# Patient Record
Sex: Male | Born: 1945 | Race: White | Hispanic: No | Marital: Single | Smoking: Never smoker
Health system: Southern US, Community
[De-identification: ages and names within clinical notes are randomized; demographics above are authoritative.]

## PROBLEM LIST (undated history)

## (undated) DIAGNOSIS — N201 Calculus of ureter: Secondary | ICD-10-CM

## (undated) DIAGNOSIS — E785 Hyperlipidemia, unspecified: Secondary | ICD-10-CM

## (undated) DIAGNOSIS — H409 Unspecified glaucoma: Secondary | ICD-10-CM

## (undated) DIAGNOSIS — I10 Essential (primary) hypertension: Secondary | ICD-10-CM

## (undated) DIAGNOSIS — Z973 Presence of spectacles and contact lenses: Secondary | ICD-10-CM

## (undated) DIAGNOSIS — Z9189 Other specified personal risk factors, not elsewhere classified: Secondary | ICD-10-CM

## (undated) DIAGNOSIS — IMO0001 Reserved for inherently not codable concepts without codable children: Secondary | ICD-10-CM

## (undated) DIAGNOSIS — I6523 Occlusion and stenosis of bilateral carotid arteries: Secondary | ICD-10-CM

## (undated) DIAGNOSIS — R350 Frequency of micturition: Secondary | ICD-10-CM

## (undated) DIAGNOSIS — I2510 Atherosclerotic heart disease of native coronary artery without angina pectoris: Secondary | ICD-10-CM

## (undated) DIAGNOSIS — R3915 Urgency of urination: Secondary | ICD-10-CM

## (undated) DIAGNOSIS — N400 Enlarged prostate without lower urinary tract symptoms: Secondary | ICD-10-CM

## (undated) HISTORY — PX: CATARACT EXTRACTION W/ INTRAOCULAR LENS  IMPLANT, BILATERAL: SHX1307

## (undated) HISTORY — DX: Reserved for inherently not codable concepts without codable children: IMO0001

## (undated) HISTORY — PX: INGUINAL HERNIA REPAIR: SUR1180

## (undated) HISTORY — PX: CARDIOVASCULAR STRESS TEST: SHX262

## (undated) HISTORY — DX: Essential (primary) hypertension: I10

## (undated) HISTORY — DX: Hyperlipidemia, unspecified: E78.5

## (undated) HISTORY — PX: COLONOSCOPY: SHX174

---

## 2002-01-31 ENCOUNTER — Encounter: Admission: RE | Admit: 2002-01-31 | Discharge: 2002-01-31 | Payer: Self-pay | Admitting: Internal Medicine

## 2004-07-29 ENCOUNTER — Ambulatory Visit: Payer: Self-pay | Admitting: *Deleted

## 2004-07-30 ENCOUNTER — Ambulatory Visit: Payer: Self-pay | Admitting: *Deleted

## 2004-08-11 ENCOUNTER — Ambulatory Visit: Payer: Self-pay

## 2005-01-26 ENCOUNTER — Ambulatory Visit: Payer: Self-pay | Admitting: *Deleted

## 2005-01-29 ENCOUNTER — Ambulatory Visit: Payer: Self-pay | Admitting: *Deleted

## 2005-08-06 ENCOUNTER — Ambulatory Visit: Payer: Self-pay | Admitting: *Deleted

## 2005-08-12 ENCOUNTER — Ambulatory Visit: Payer: Self-pay

## 2005-08-12 ENCOUNTER — Ambulatory Visit: Payer: Self-pay | Admitting: *Deleted

## 2006-03-02 ENCOUNTER — Ambulatory Visit: Payer: Self-pay | Admitting: *Deleted

## 2006-03-10 ENCOUNTER — Ambulatory Visit: Payer: Self-pay | Admitting: *Deleted

## 2006-07-14 ENCOUNTER — Ambulatory Visit: Payer: Self-pay | Admitting: *Deleted

## 2006-07-14 LAB — CONVERTED CEMR LAB
ALT: 28 units/L (ref 0–40)
AST: 30 units/L (ref 0–37)
Albumin: 4 g/dL (ref 3.5–5.2)
Alkaline Phosphatase: 68 units/L (ref 39–117)
BUN: 20 mg/dL (ref 6–23)
Bilirubin, Direct: 0.1 mg/dL (ref 0.0–0.3)
CO2: 28 meq/L (ref 19–32)
Calcium: 9.6 mg/dL (ref 8.4–10.5)
Chloride: 106 meq/L (ref 96–112)
Chol/HDL Ratio, serum: 2.1
Cholesterol: 150 mg/dL (ref 0–200)
Creatinine, Ser: 1.4 mg/dL (ref 0.4–1.5)
GFR calc non Af Amer: 55 mL/min
Glomerular Filtration Rate, Af Am: 66 mL/min/{1.73_m2}
Glucose, Bld: 99 mg/dL (ref 70–99)
HDL: 70.3 mg/dL (ref >39.0)
LDL Cholesterol: 66 mg/dL (ref 0–99)
Potassium: 3.7 meq/L (ref 3.5–5.1)
Sodium: 141 meq/L (ref 135–145)
Total Bilirubin: 1.1 mg/dL (ref 0.3–1.2)
Total Protein: 7 g/dL (ref 6.0–8.3)
Triglyceride fasting, serum: 68 mg/dL (ref 0–149)
VLDL: 14 mg/dL (ref 0–40)

## 2006-07-27 ENCOUNTER — Ambulatory Visit: Payer: Self-pay | Admitting: *Deleted

## 2007-01-25 ENCOUNTER — Ambulatory Visit: Payer: Self-pay | Admitting: *Deleted

## 2007-01-25 LAB — CONVERTED CEMR LAB
BUN: 20 mg/dL (ref 6–23)
CO2: 29 meq/L (ref 19–32)
Calcium: 9.2 mg/dL (ref 8.4–10.5)
Chloride: 104 meq/L (ref 96–112)
Cholesterol: 175 mg/dL (ref 0–200)
Creatinine, Ser: 1.3 mg/dL (ref 0.4–1.5)
GFR calc Af Amer: 72 mL/min
GFR calc non Af Amer: 59 mL/min
Glucose, Bld: 109 mg/dL — ABNORMAL HIGH (ref 70–99)
HDL: 66.5 mg/dL (ref >39.0)
LDL Cholesterol: 90 mg/dL (ref 0–99)
Potassium: 3.9 meq/L (ref 3.5–5.1)
Sodium: 139 meq/L (ref 135–145)
Total CHOL/HDL Ratio: 2.6
Triglycerides: 91 mg/dL (ref 0–149)
VLDL: 18 mg/dL (ref 0–40)

## 2007-01-27 ENCOUNTER — Ambulatory Visit: Payer: Self-pay | Admitting: *Deleted

## 2007-07-20 LAB — CONVERTED CEMR LAB
ALT: 28 units/L (ref 0–53)
AST: 28 units/L (ref 0–37)
Albumin: 4.1 g/dL (ref 3.5–5.2)
Alkaline Phosphatase: 68 units/L (ref 39–117)
BUN: 21 mg/dL (ref 6–23)
Bilirubin, Direct: 0.2 mg/dL (ref 0.0–0.3)
CO2: 31 meq/L (ref 19–32)
Calcium: 9.5 mg/dL (ref 8.4–10.5)
Chloride: 101 meq/L (ref 96–112)
Cholesterol: 147 mg/dL (ref 0–200)
Creatinine, Ser: 1.3 mg/dL (ref 0.4–1.5)
GFR calc Af Amer: 72 mL/min
GFR calc non Af Amer: 59 mL/min
Glucose, Bld: 108 mg/dL — ABNORMAL HIGH (ref 70–99)
HDL: 64.5 mg/dL (ref >39.0)
LDL Cholesterol: 68 mg/dL (ref 0–99)
Potassium: 4 meq/L (ref 3.5–5.1)
Sodium: 140 meq/L (ref 135–145)
Total Bilirubin: 1.4 mg/dL — ABNORMAL HIGH (ref 0.3–1.2)
Total CHOL/HDL Ratio: 2.3
Total Protein: 7.2 g/dL (ref 6.0–8.3)
Triglycerides: 75 mg/dL (ref 0–149)
VLDL: 15 mg/dL (ref 0–40)

## 2007-07-22 ENCOUNTER — Ambulatory Visit: Payer: Self-pay | Admitting: Internal Medicine

## 2007-07-26 ENCOUNTER — Ambulatory Visit: Payer: Self-pay | Admitting: Internal Medicine

## 2008-07-04 ENCOUNTER — Ambulatory Visit: Payer: Self-pay

## 2008-07-04 ENCOUNTER — Ambulatory Visit: Payer: Self-pay | Admitting: Internal Medicine

## 2008-07-04 LAB — CONVERTED CEMR LAB
ALT: 33 units/L (ref 0–53)
AST: 30 units/L (ref 0–37)
Albumin: 4.1 g/dL (ref 3.5–5.2)
Alkaline Phosphatase: 63 units/L (ref 39–117)
BUN: 22 mg/dL (ref 6–23)
Bilirubin, Direct: 0.1 mg/dL (ref 0.0–0.3)
CO2: 27 meq/L (ref 19–32)
Calcium: 9.3 mg/dL (ref 8.4–10.5)
Chloride: 104 meq/L (ref 96–112)
Cholesterol: 146 mg/dL (ref 0–200)
Creatinine, Ser: 1.4 mg/dL (ref 0.4–1.5)
GFR calc Af Amer: 66 mL/min
GFR calc non Af Amer: 54 mL/min
Glucose, Bld: 118 mg/dL — ABNORMAL HIGH (ref 70–99)
HDL: 69 mg/dL (ref >39.0)
LDL Cholesterol: 63 mg/dL (ref 0–99)
Potassium: 3.5 meq/L (ref 3.5–5.1)
Sodium: 141 meq/L (ref 135–145)
Total Bilirubin: 1.7 mg/dL — ABNORMAL HIGH (ref 0.3–1.2)
Total CHOL/HDL Ratio: 2.1
Total Protein: 7.1 g/dL (ref 6.0–8.3)
Triglycerides: 70 mg/dL (ref 0–149)
VLDL: 14 mg/dL (ref 0–40)

## 2009-01-11 ENCOUNTER — Ambulatory Visit: Payer: Self-pay | Admitting: Internal Medicine

## 2009-01-11 LAB — CONVERTED CEMR LAB
ALT: 44 units/L (ref 0–53)
AST: 35 units/L (ref 0–37)
Albumin: 4.2 g/dL (ref 3.5–5.2)
Alkaline Phosphatase: 72 units/L (ref 39–117)
BUN: 15 mg/dL (ref 6–23)
Bilirubin, Direct: 0.2 mg/dL (ref 0.0–0.3)
CO2: 31 meq/L (ref 19–32)
Calcium: 9.2 mg/dL (ref 8.4–10.5)
Chloride: 107 meq/L (ref 96–112)
Cholesterol: 150 mg/dL (ref 0–200)
Creatinine, Ser: 1.3 mg/dL (ref 0.4–1.5)
GFR calc non Af Amer: 58.9 mL/min (ref >60)
Glucose, Bld: 111 mg/dL — ABNORMAL HIGH (ref 70–99)
HDL: 68.5 mg/dL (ref >39.00)
LDL Cholesterol: 65 mg/dL (ref 0–99)
Potassium: 3.6 meq/L (ref 3.5–5.1)
Sodium: 143 meq/L (ref 135–145)
Total Bilirubin: 1.4 mg/dL — ABNORMAL HIGH (ref 0.3–1.2)
Total CHOL/HDL Ratio: 2
Total Protein: 7 g/dL (ref 6.0–8.3)
Triglycerides: 83 mg/dL (ref 0.0–149.0)
VLDL: 16.6 mg/dL (ref 0.0–40.0)

## 2009-01-15 ENCOUNTER — Encounter (INDEPENDENT_AMBULATORY_CARE_PROVIDER_SITE_OTHER): Payer: Self-pay | Admitting: *Deleted

## 2009-01-16 ENCOUNTER — Ambulatory Visit: Payer: Self-pay | Admitting: Internal Medicine

## 2009-01-16 DIAGNOSIS — R0600 Dyspnea, unspecified: Secondary | ICD-10-CM

## 2009-01-16 DIAGNOSIS — I10 Essential (primary) hypertension: Secondary | ICD-10-CM

## 2009-01-16 DIAGNOSIS — R0609 Other forms of dyspnea: Secondary | ICD-10-CM

## 2009-01-16 DIAGNOSIS — R0602 Shortness of breath: Secondary | ICD-10-CM

## 2009-01-16 DIAGNOSIS — E876 Hypokalemia: Secondary | ICD-10-CM | POA: Insufficient documentation

## 2009-01-16 DIAGNOSIS — E785 Hyperlipidemia, unspecified: Secondary | ICD-10-CM | POA: Insufficient documentation

## 2009-01-16 HISTORY — DX: Essential (primary) hypertension: I10

## 2009-01-16 HISTORY — DX: Dyspnea, unspecified: R06.00

## 2009-01-16 HISTORY — DX: Hyperlipidemia, unspecified: E78.5

## 2009-01-16 HISTORY — DX: Hypokalemia: E87.6

## 2009-01-16 HISTORY — DX: Other forms of dyspnea: R06.09

## 2009-01-23 ENCOUNTER — Telehealth: Payer: Self-pay | Admitting: Internal Medicine

## 2009-01-25 ENCOUNTER — Encounter: Payer: Self-pay | Admitting: Internal Medicine

## 2009-01-29 ENCOUNTER — Encounter: Payer: Self-pay | Admitting: Internal Medicine

## 2009-07-25 ENCOUNTER — Ambulatory Visit: Payer: Self-pay

## 2009-07-25 ENCOUNTER — Encounter: Payer: Self-pay | Admitting: Internal Medicine

## 2009-07-25 DIAGNOSIS — I6529 Occlusion and stenosis of unspecified carotid artery: Secondary | ICD-10-CM

## 2009-07-25 HISTORY — DX: Occlusion and stenosis of unspecified carotid artery: I65.29

## 2009-11-29 ENCOUNTER — Telehealth: Payer: Self-pay | Admitting: Internal Medicine

## 2010-03-26 ENCOUNTER — Telehealth: Payer: Self-pay | Admitting: Internal Medicine

## 2010-04-03 ENCOUNTER — Encounter: Payer: Self-pay | Admitting: Internal Medicine

## 2010-04-04 ENCOUNTER — Encounter: Payer: Self-pay | Admitting: Internal Medicine

## 2010-04-04 ENCOUNTER — Ambulatory Visit: Payer: Self-pay

## 2010-04-14 ENCOUNTER — Ambulatory Visit: Payer: Self-pay | Admitting: Internal Medicine

## 2010-04-14 DIAGNOSIS — R072 Precordial pain: Secondary | ICD-10-CM

## 2010-04-14 HISTORY — DX: Precordial pain: R07.2

## 2010-04-29 ENCOUNTER — Telehealth: Payer: Self-pay | Admitting: Internal Medicine

## 2010-05-24 ENCOUNTER — Encounter: Payer: Self-pay | Admitting: Internal Medicine

## 2010-05-26 ENCOUNTER — Telehealth (INDEPENDENT_AMBULATORY_CARE_PROVIDER_SITE_OTHER): Payer: Self-pay | Admitting: *Deleted

## 2010-05-27 ENCOUNTER — Ambulatory Visit (HOSPITAL_COMMUNITY): Admission: RE | Admit: 2010-05-27 | Discharge: 2010-05-27 | Payer: Self-pay | Admitting: Internal Medicine

## 2010-05-27 ENCOUNTER — Ambulatory Visit: Payer: Self-pay | Admitting: Cardiology

## 2010-06-03 LAB — CONVERTED CEMR LAB
Bilirubin, Direct: 0.2 mg/dL (ref 0.0–0.3)
CO2: 25 meq/L (ref 19–32)
Calcium: 9.2 mg/dL (ref 8.4–10.5)
Glucose, Bld: 102 mg/dL — ABNORMAL HIGH (ref 70–99)
LDL Cholesterol: 76 mg/dL (ref 0–99)
Magnesium: 2 mg/dL (ref 1.5–2.5)
Potassium: 4.4 meq/L (ref 3.5–5.3)
Sodium: 143 meq/L (ref 135–145)
Total Bilirubin: 0.9 mg/dL (ref 0.3–1.2)
Total CHOL/HDL Ratio: 2.3
VLDL: 10 mg/dL (ref 0–40)

## 2010-08-22 ENCOUNTER — Telehealth: Payer: Self-pay | Admitting: Internal Medicine

## 2010-09-14 ENCOUNTER — Encounter: Payer: Self-pay | Admitting: Internal Medicine

## 2010-09-21 LAB — CONVERTED CEMR LAB
BUN: 19 mg/dL (ref 6–23)
CO2: 30 meq/L (ref 19–32)
Calcium: 9.8 mg/dL (ref 8.4–10.5)
Chloride: 103 meq/L (ref 96–112)
Creatinine, Ser: 1.2 mg/dL (ref 0.4–1.5)
GFR calc non Af Amer: 66.92 mL/min (ref >60)
Glucose, Bld: 105 mg/dL — ABNORMAL HIGH (ref 70–99)
Potassium: 4.2 meq/L (ref 3.5–5.1)
Sodium: 143 meq/L (ref 135–145)

## 2010-09-23 NOTE — Progress Notes (Signed)
Summary: refill pt out medication   Phone Note Refill Request Message from:  Patient on March 26, 2010 4:50 PM  Refills Requested: Medication #1:  LIPITOR 80 MG TABS Take one tablet by mouth daily..  Medication #2:  AMLODIPINE BESYLATE 10 MG TABS Take one tablet by mouth daily  Medication #3:  ZETIA 10 MG TABS Take one tablet by mouth daily. Prescription [facility] [phone]  Initial call taken by: Judie Grieve,  March 26, 2010 4:51 PM  Follow-up for Phone Call        spoke with pt filled meds and scheduled f4m Carotid  Follow-up by: Hardin Negus, RMA,  March 27, 2010 3:51 PM    Prescriptions: LIPITOR 80 MG TABS (ATORVASTATIN CALCIUM) Take one tablet by mouth daily.  #90 x 3   Entered by:   Hardin Negus, RMA   Authorized by:   Dolores Patty, MD, [facility]   Signed by:   Hardin Negus, RMA on 03/27/2010   Method used:   Electronically to        [facility] (mail-order)       [address]       [place], [place]  [address]       Ph: [phone]       Fax: [phone]   RxID:   [id number] ZETIA 10 MG TABS (EZETIMIBE) Take one tablet by mouth daily.  #90 x 3   Entered by:   Hardin Negus, RMA   Authorized by:   Dolores Patty, MD, [facility]   Signed by:   Hardin Negus, RMA on 03/27/2010   Method used:   Electronically to        [facility] (mail-order)       [address]       [place], [place]  [address]       Ph: [phone]       Fax: [phone]   RxID:   [id number] AMLODIPINE BESYLATE 10 MG TABS (AMLODIPINE BESYLATE) Take one tablet by mouth daily  #90 x 3   Entered by:   Hardin Negus, RMA   Authorized by:   Dolores Patty, MD, [facility]   Signed by:   Hardin Negus, RMA on 03/27/2010   Method used:   Electronically to        [facility] (mail-order)       [address]       [place], [place]  [address]       Ph: [phone]       Fax: [phone]   RxID:    [id number]

## 2010-09-23 NOTE — Miscellaneous (Signed)
Summary: Orders Update  Clinical Lists Changes  Orders: Added new Test order of Carotid Duplex (Carotid Duplex) - Signed 

## 2010-09-23 NOTE — Progress Notes (Signed)
Summary: refill**[facility]**   Phone Note Refill Request   Refills Requested: Medication #1:  klor-con 20mg  1 tab 3x daily   Supply Requested: 3 months [facility] 1- [phone]    Method Requested: Fax to Mail Away Pharm[facility] Initial call taken by: Migdalia Dk,  November 29, 2009 9:51 AM    Prescriptions: POTASSIUM CHLORIDE CRYS CR 20 MEQ CR-TABS (POTASSIUM CHLORIDE CRYS CR) Take 2 TABS EVERY AM AND 1 TAB EVERY PM  #270 x 4   Entered by:   Hardin Negus, RMA   Authorized by:   Dolores Patty, MD, [facility]   Signed by:   Hardin Negus, RMA on 11/29/2009   Method used:   Electronically to        [facility]* (mail-order)       [address]       [place], [place]  [address]       Ph: [phone]       Fax: [phone]   RxID:   [id number]

## 2010-09-23 NOTE — Assessment & Plan Note (Signed)
Summary: f/u 1 year  Medications Added POTASSIUM CHLORIDE CRYS CR 20 MEQ CR-TABS (POTASSIUM CHLORIDE CRYS CR) Take 2 TABS EVERY AM AND 1 TAB EVERY PM ASPIRIN 81 MG TBEC (ASPIRIN) Take one tablet by mouth daily DOXAZOSIN MESYLATE 2 MG TABS (DOXAZOSIN MESYLATE) 1 tab once daily        Visit Type:  1 yr f/u Primary Provider:  [facility]  CC:  pt states he has "general" sob....edema/ankles at times if he does not take his HCTZ...denies any cp.  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 Glennon Hamilton, returns for routine followup.   He has a history for hypertension, hyperlipidemia and allergies.  He does not have any known coronary artery disease, did have a stress test in December, 2006 and again in November 2009 which were normal.  He has never had a cardiac catheterization.   Overall doing fairly well though he feels like he is getting older. Takes a while to get engine going; feels stiff. Very active walking back and forth from his home to shop but chest gets a little tight if walking up hill. Used a pedometer and showed he was walking 6 miles per day. No CP. Notices if he doesnt take HCTZ ankles will swell. SBP ranging 106-127.  Had carotid u/s this month 60-79% B. Unchanged. No focal neuro symptoms.   Lipids due for today.   Current Medications (verified): 1)  Potassium Chloride Crys Cr 20 Meq Cr-Tabs (Potassium Chloride Crys Cr) .... Take 2 Tabs Every Am and 1 Tab Every Pm 2)  Zetia 10 Mg Tabs (Ezetimibe) .... Take One Tablet By Mouth Daily. 3)  Synthroid 100 Mcg Tabs (Levothyroxine Sodium) .... Take One Tablet By Mouth Once Daily. 4)  Aspirin 81 Mg Tbec (Aspirin) .... Take One Tablet By Mouth Daily 5)  Vitamin C 500 Mg  Tabs (Ascorbic Acid) .... Take One Tablet By Mouth Once Daily. 6)  Vitamin E 600 Unit  Caps (Vitamin E) .... Take One Tablet By Mouth Once Daily. 7)   Amlodipine Besylate 10 Mg Tabs (Amlodipine Besylate) .... Take One Tablet By Mouth Daily 8)  Doxazosin Mesylate 2 Mg Tabs (Doxazosin Mesylate) .Marland Kitchen.. 1 Tab Once Daily 9)  Hydrochlorothiazide 12.5 Mg Tabs (Hydrochlorothiazide) .... Take One Tablet By Mouth Daily. 10)  Lipitor 80 Mg Tabs (Atorvastatin Calcium) .... Take One Tablet By Mouth Daily.  Allergies: 1)  ! Codeine 2)  ! * Qiupset 3)  Ace Inhibitors  Past History:  Past Medical History: Last updated: 01/16/2009 HTN Hyperlipidemia Negative stress test 2006 ETT 11/09   -10 minutes on Bruce. ECG negative Hypothyroidism  Review of Systems       As per HPI and past medical history; otherwise all systems negative.   Vital Signs:  Patient profile:   65 year old male Height:      72 inches Weight:      197.8 pounds BMI:     26.92 Pulse rate:   62 / minute Pulse rhythm:   irregular BP sitting:   116 / 78  (left arm) Cuff size:   large  Vitals Entered By: Danielle Rankin, CMA (April 14, 2010 9:25 AM)  Physical Exam  General:  Well appearing. no resp difficulty HEENT: normal  Neck: supple. no JVD. Carotids 2+ bilat; not bruits. No lymphadenopathy or thryomegaly appreciated. Cor: PMI nondisplaced. Regular rate & rhythm. No rubs, murmur. +s4 Lungs: clear Abdomen: soft,  nontender, nondistended. No hepatosplenomegaly. No bruits or masses. Good bowel sounds. Extremities: no cyanosis, clubbing, rash, edema Neuro: alert & orientedx3, cranial nerves grossly intact. moves all 4 extremities w/o difficulty. affect pleasant    Impression & Recommendations:  Problem # 1:  Chest tightness. He has a signifcant amount of vascular disease and is at high risk for CAD. 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 Asymptomatic. Continue sureveillance. Continue asa and statin. At some point would also consider ab u/s to evaluate for AAA.   Problem # 3:   HYPERLIPIDEMIA (ICD-272.4)  Goal ldl < 70. Continue current regiment. Check lipids and liver panel.   Orders: T-Lipid Profile [id number]) T-Basic Metabolic Panel [phone]) T-Hepatic Function [phone]) TLB-BMP (Basic Metabolic Panel-BMET) (80048-METABOL)  Problem # 4:  CAROTID STENOSIS (ICD-433.10)  Orders: EKG w/ Interpretation (93000) T-Lipid Profile ([id number]) T-Basic Metabolic Panel ([id number]) T-Hepatic Function [phone])  Other Orders: Cardiac CTA (Cardiac CTA)  Patient Instructions: 1)  Your physician recommends that you schedule a follow-up appointment in: 1 year with Dr. Gala Romney 2)  Your physician has requested that you have a carotid duplex in 6 months. 3)  Your physician recommends that you return for a FASTING lipid profile, liver, and bmet next week  786.51, 433.1, 272.4  at [facility] in [place] 4)  Your physician recommends that you have a bmet today in evaluation for the Promise Study

## 2010-09-23 NOTE — Progress Notes (Signed)
Summary: pls include manganese   Phone Note Call from Patient Call back at Home Phone [phone]   Caller: Patient Reason for Call: Talk to Nurse Summary of Call: Per pt calling,  pt schedule for some lab test - pls include manganese.  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

## 2010-09-23 NOTE — Progress Notes (Addendum)
   Phone Note Outgoing Call   Summary of Call: Patient had questions about instructions for Cardiac CT. I confirmed meds with Dr. Gala Romney. Patient is to not hold any meds prior to Cardiac Ct.Patient is to have nothing to eat or drink 4 hours prior to procedure. No pre-med is needed. I caled patient with instructions. Patient verbalized understanding.

## 2010-09-25 NOTE — Progress Notes (Signed)
Summary: Refill meds, Need new PCP  Medications Added SYNTHROID 100 MCG TABS (LEVOTHYROXINE SODIUM) Take one tablet by mouth once daily. HYDROCHLOROTHIAZIDE 25 MG TABS (HYDROCHLOROTHIAZIDE) Take as directed. HYDROCHLOROTHIAZIDE 25 MG TABS (HYDROCHLOROTHIAZIDE) Take 1/2 tablet by mouth once daily       Phone Note Call from Patient Call back at Home Phone [phone] Call back at Work Phone [phone]   Caller: Patient Summary of Call: discuss rx that need to be filled, pt aware that heather is off today will be returning on tuesday. will wait to discuss with her.  Initial call taken by: Lorne Skeens,  August 22, 2010 1:43 PM  Follow-up for Phone Call        pt would like to speak with a nurse re meds Follow-up by: Roe Coombs,  August 26, 2010 10:21 AM  Additional Follow-up for Phone Call Additional follow up Details #1::        I spoke with the pt and his PCP retired and he is in the process of locating a new PCP.  The pt is almost out of levothyroxine daily and HCTZ 25mg  1/2 daily.  The pt would like a 90 day supply sent to  [facility] in [place] [phone]).  I made the pt aware that our office can only fill the Levothyroxine for 90 days with no refills and in the future this would need to be filled by new PCP.  The pt would also like to know which PCP within [place] that Dr Gala Romney would recommend.  The pt would like a call back with this information. Additional Follow-up by: Julieta Gutting, RN, BSN,  August 26, 2010 10:42 AM    Additional Follow-up for Phone Call Additional follow up Details #2::    gave recommendations for [facility] office Meredith Staggers, RN  August 29, 2010 3:38 PM   New/Updated Medications: SYNTHROID 100 MCG TABS (LEVOTHYROXINE SODIUM) Take one tablet by mouth once daily. HYDROCHLOROTHIAZIDE 25 MG TABS (HYDROCHLOROTHIAZIDE) Take as directed. HYDROCHLOROTHIAZIDE 25 MG TABS (HYDROCHLOROTHIAZIDE) Take 1/2 tablet by mouth once  daily Prescriptions: LIPITOR 80 MG TABS (ATORVASTATIN CALCIUM) Take one tablet by mouth daily.  #90 x 3   Entered by:   Meredith Staggers, RN   Authorized by:   Dolores Patty, MD, [facility]   Signed by:   Meredith Staggers, RN on 08/29/2010   Method used:   Electronically to        [facility] (mail-order)       [address]       [place], [place]  [address]       Ph: [phone]       Fax: [phone]   RxID:   [id number] HYDROCHLOROTHIAZIDE 25 MG TABS (HYDROCHLOROTHIAZIDE) Take as directed.  #90 x 3   Entered by:   Julieta Gutting, RN, BSN   Authorized by:   Dolores Patty, MD, [facility]   Signed by:   Julieta Gutting, RN, BSN on 08/26/2010   Method used:   Electronically to        [facility] (retail)       [address]       [place], [place]  [address]       Ph: [phone] or [phone]       Fax: [phone]   RxID:   [id number] SYNTHROID 100 MCG TABS (LEVOTHYROXINE SODIUM) Take one tablet by mouth once daily.  #90 x  0   Entered by:   Julieta Gutting, RN, BSN   Authorized by:   Dolores Patty, MD, [facility]   Signed by:   Julieta Gutting, RN, BSN on 08/26/2010   Method used:   Electronically to        [facility] (retail)       [address]       [place], [place]  [address]       Ph: [phone] or [phone]       Fax: [phone]   RxID:   [id number]

## 2010-10-25 IMAGING — CT CT HEART MORP W/ CTA COR W/ SCORE W/ CA W/CM &/OR W/O CM
2 of 4 series · 14 of 20 positions shown, 16 images · IV contrast (APPLIED)
Comparison: None.

Addendum Begins

CARDIAC CTA WITH CALCIUM SCORE 05/27/2010 [DATE]
Ordering Physician: Gjosho Matt
Torie Physician: Blain Jumper
PROTOCOL: The patient scanned on a Siemens sensations 64 slice
scanner.  Gantry rotation speed was 320 milliseconds.  Collimation
was [DATE] mm . Reconstruction overlap was [DATE] mm.   15
mg of IV Lopressor was administered.  Average heart rate during the
scan was 55 beats per minute.  After an initial AP and lateral
topogram, 3 mm axial slices were performed through the heart for
calcium scoring.  The patient then received 20 ml of contrast for a
timing bolus with a region of interest in the ascending aorta.  A
delay of 22 seconds was used.  The patient then had a 100 ml of
contrast given for coronary CTA.  The 3-D data set was then sent to
the Siemens workstation.  Reconstructions were done using MIP,MPR
and VRT modes.

[Series 6: axial · axial · 0.51mm/px · z∈[-264,-161]mm · 6 of 291 slices shown, 8 images]
[im 42/291  vessel]
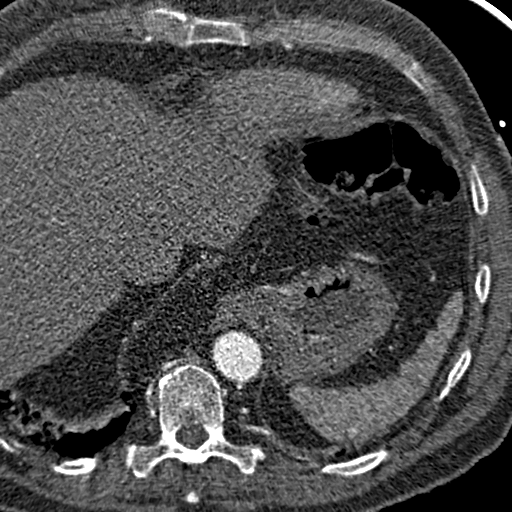
[im 42/291  lung]
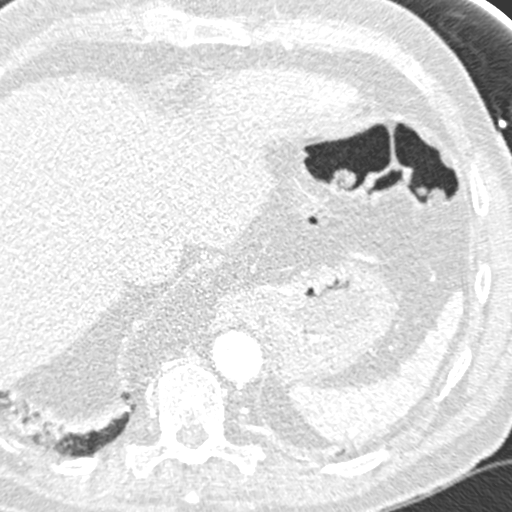
[im 83/291  vessel]
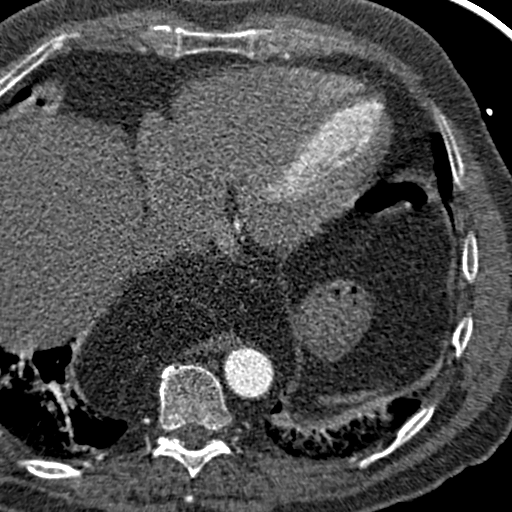
[im 125/291  vessel]
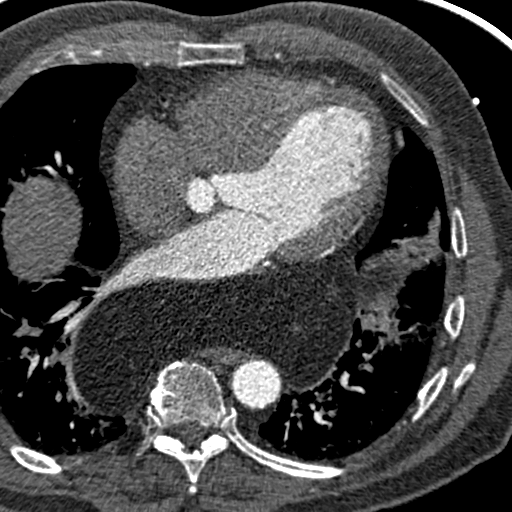
[im 166/291  vessel]
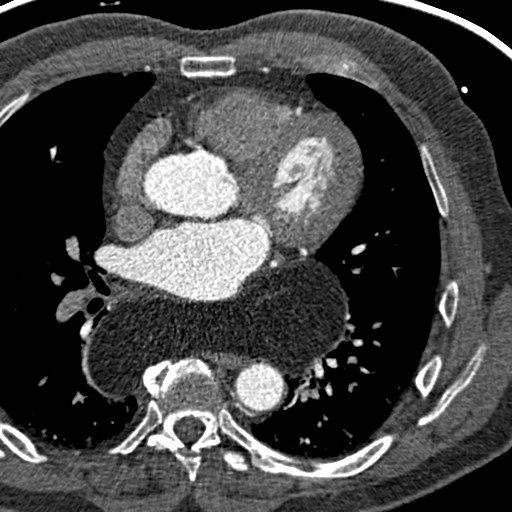
[im 208/291  vessel]
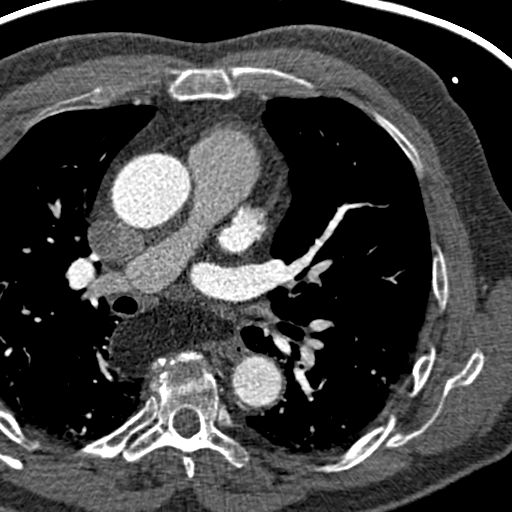
[im 208/291  lung]
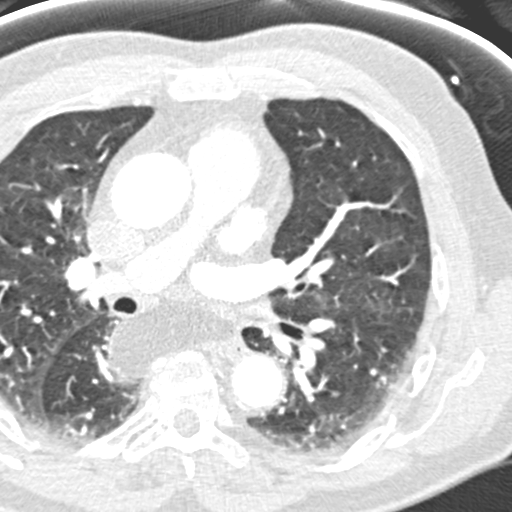
[im 249/291  vessel]
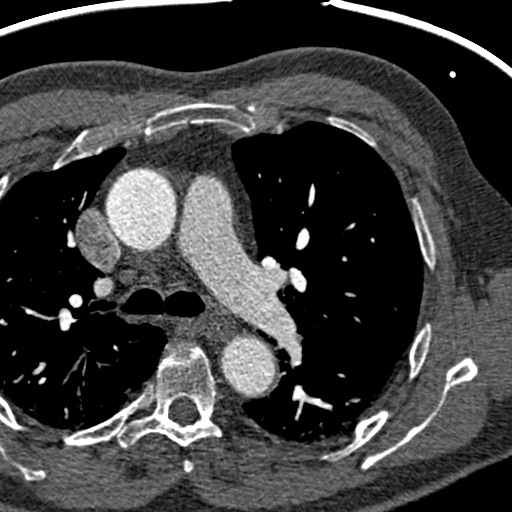

[Series 11: 70% only · axial · 0.51mm/px · z∈[-269,-156]mm · 8 of 363 slices shown]
[im 41/363  vessel]
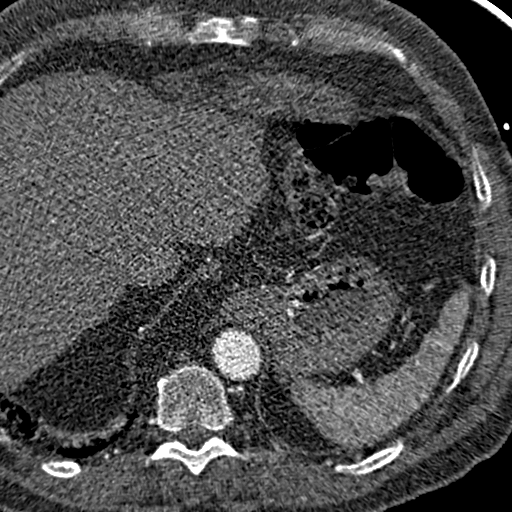
[im 81/363  vessel]
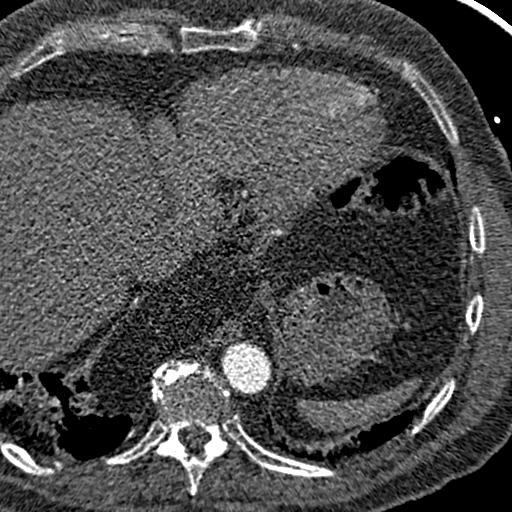
[im 121/363  vessel]
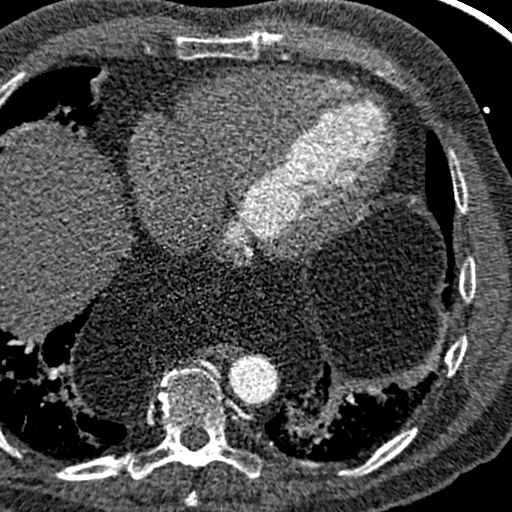
[im 161/363  vessel]
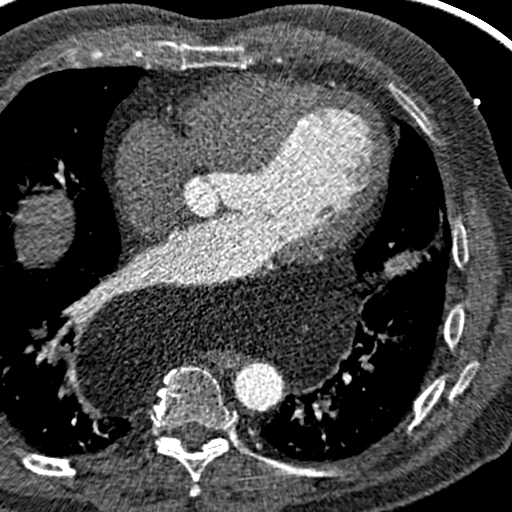
[im 202/363  vessel]
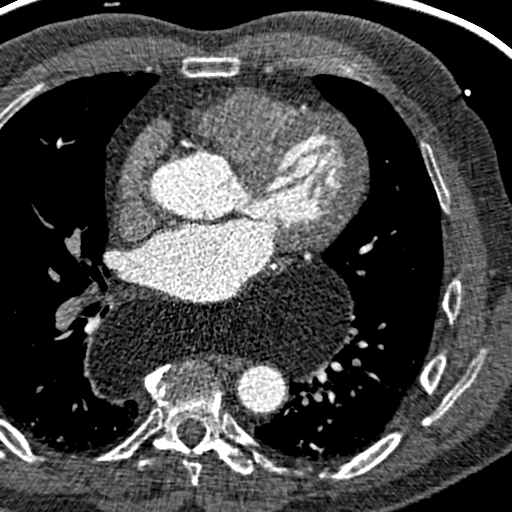
[im 242/363  vessel]
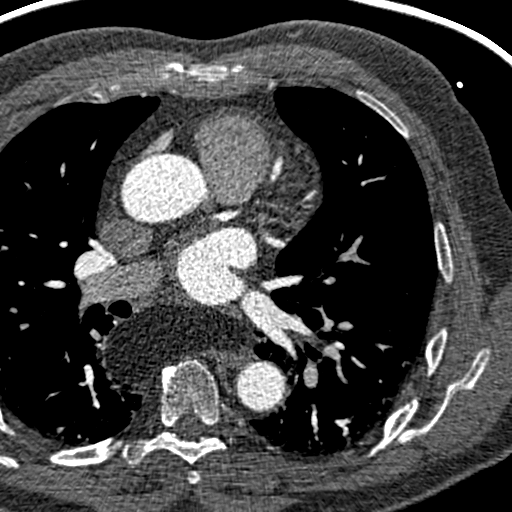
[im 282/363  vessel]
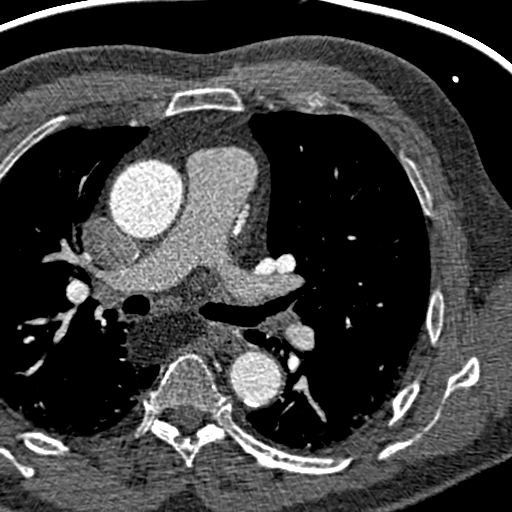
[im 322/363  vessel]
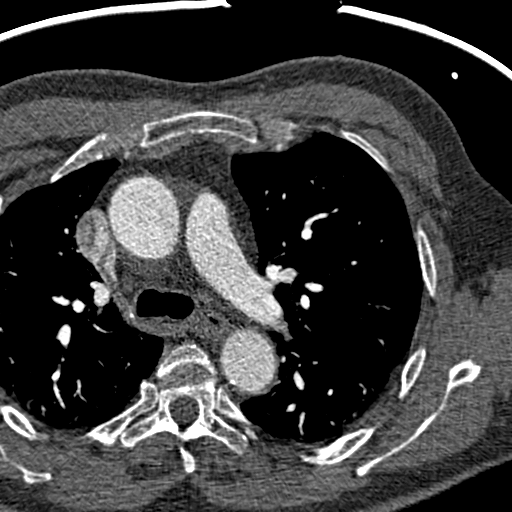

[14 of 20 positions shown; findings below may reference images not displayed]

Indications: Chest pain

DETAILED FINDINGS:

Quality of Study: Excellent

Left Main: No plaque or stenosis

Left Anterior Descending: Calcified plaque with mild stenosis in
the proximal LAD after the 1st diagonal.  Moderate-sized 1st
diagonal had no plaque or stenosis.  No plaque or stenosis in the
mid to distal LAD.

Left Circumflex: Dominant vessel providing left-sided PDA.
Calcified plaque without significant stenosis in the proximal CFX.
Large 1st obtuse marginal with no plaque or stenosis.  Mid to
distal CFX and left-sided PDA had no significant plaque or
stenosis.

Right Coronary Artery: Small nondominant vessel with no plaque or
stenosis.

Coronary Calcium Score: 120 Agatston units
IMPRESSION: 1.  Calcium score of 120 Agatston units.  This places the patient
in the 55th percentile for his age and gender.  This suggests
intermediate risk for future cardiac events.

2.  Mild proximal LAD stenosis (estimate around 25%).  Otherwise,
no significant disease.

Addendum Ends
OVER-READ INTERPRETATION - CT CHEST

The following report is an over-read performed by radiologist Dr.
[DATE].  This over-read does not include interpretation of
cardiac or coronary anatomy or pathology.  The CTA interpretation
by the cardiologist is attached.
FINDINGS: Aorta is normal caliber.  There is a large hiatal hernia
which contains only fat.  There is bibasilar atelectasis.  No
pleural effusions.  Visualized lung fields are otherwise clear.  No
adenopathy in the visualized mediastinum or hila.

Imaging into the upper abdomen shows no acute findings.  No acute
bony abnormality.
IMPRESSION: Large hiatal hernia containing only fat.

Bibasilar opacities, likely atelectasis.

## 2010-12-02 ENCOUNTER — Telehealth: Payer: Self-pay | Admitting: Internal Medicine

## 2010-12-02 MED ORDER — LEVOTHYROXINE SODIUM 100 MCG PO TABS: 100.0000 ug | ORAL_TABLET | Freq: Every day | ORAL | Status: DC

## 2010-12-02 NOTE — Telephone Encounter (Signed)
Spoke w/pt he states he needs refill on klor-con [facility] at [phone] and he is requesting a refill on his levothyroxine daily, he currently does not have a pcp, advised I would sent it in this time for a #90 day supply but no refills and he has to get a pcp he is agreeable

## 2011-01-06 NOTE — Letter (Signed)
January 27, 2007    [facility]  [address] [place]  [place], [place] [address]   RE:  ILIYA, Darrell Williams  MRN:  [id number]  /  DOB:  [birth year]   Dear Loraine Leriche:   It was pleasure to see your nice patient, Jeffery Bachmeier for followup on  January 27, 2007.  As you know, he is a very pleasant nearly 65 year old  white male with multiple risk factors for coronary artery disease  without previously documented coronary disease.   He has hypertension and hyperlipidemia.  Lipids have generally been well  controlled, although recent LDL was up to 90.  Blood pressures have been  well controlled as well.  He is concerned about occasional swelling in  his leg, though he has none now.  He has no cardiac symptoms.  He is  planning a trip to [place] in the near future.   MEDICATIONS:  Include:  1. Caduet 10/80 mg daily.  2. Zetia 10 mg daily.  3. K-Dur 20 b.i.d.  4. Drixoral.  5. Micardis/hydrochlorothiazide 25/80 one half tablet daily.  6. Synthroid 100.   Blood pressure 120/98, though he states it generally is in the 70s  diastolic.  However, repeated blood pressure was 140/80.  I should also  note he has had elevated glucose in the past.  JVP is not elevated.  Carotid pulses palpable, and equal without bruits.  LUNGS:  Clear.  CARDIAC:  Exam is normal.  ABDOMINAL:  Exam is normal.  EXTREMITIES:  Normal.   DIAGNOSIS:  As above.  EKG is normal.  I have suggested he continue on  the same therapy.  He will see you in followup concerning his blood  pressure and glucose, and for his general physical.  We will have him  see Dr. Gala Romney in our office in 6 months.   Thank you for the opportunity to share in this nice gentleman's care.    Sincerely,      E. Graceann Congress, MD, [facility]  Electronically Signed    EJL/MedQ  DD: 01/27/2007  DT: 01/27/2007  Job #: [id number]

## 2011-01-06 NOTE — Procedures (Signed)
[facility]                              EXERCISE TREADMILL   NAME:Darrell Williams, Darrell Williams                     MRN:          [id number]  DATE:07/04/2008                            DOB:          1946-06-08    INDICATION:  [name] is a very pleasant 65 year old male with  hypertension and hyperlipidemia, who was recently found to have  cholesterol plaque on his retina exam, and we are intensifying his  cardiovascular screening with a repeat treadmill test.   Resting EKG showed normal sinus rhythm at a rate of 88 with no  significant ST-T wave abnormalities.  Blood pressure was 135/93 and  heart rate was 88.   EXERCISE:  He walked for 10 minutes on a standard Bruce protocol,  stopped due to fatigue.  There was no chest pain or excessive dyspnea.  Peak heart rate was 137, which was 89% of maximum predicted heart rate.  Peak METs was 11.7.  Peak blood pressure was 153/82.  There were no  significant ECG changes with exercise.   POST EXERCISE:  Heart rate at 1 minute was 125 and heart rate at 2  minutes was 113.  There were no ECG changes in recovery.   CONCLUSION:  1. Good exercise capacity.  2. No evidence of inducible ischemia.  3. This was a normal exercise treadmill test.     Bevelyn Buckles. Bensimhon, MD  Electronically Signed    DRB/MedQ  DD: 07/04/2008  DT: 07/05/2008  Job #: [id number]

## 2011-01-06 NOTE — Assessment & Plan Note (Signed)
[facility]                            CARDIOLOGY OFFICE NOTE   NAME:Darrell Williams, Darrell Williams                     MRN:          [id number]  DATE:07/26/2007                            DOB:          Nov 17, 1945    PRIMARY CARE PHYSICIAN:  Dr. Wallace Cullens at [facility].   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 Glennon Hamilton, returns for routine followup.   He has a history for hypertension and hyperlipidemia.  He does not have  any known coronary artery disease, did have a stress test in December,  2006 which was normal.  He has never had a cardiac catheterization.   Overall, he is doing fairly well.  He remains very active in his job.  He says he walks over 8 miles a day at work without any chest pain or  shortness of breath.  He has been taking his blood pressure and  cholesterol medications religiously.  He takes a blood pressure with an  automated wrist cuff, and average systolics are in the 115-124 range,  occasionally 130s.   CURRENT MEDICATIONS:  1. Potassium 20 b.i.d.  2. Zetia 10 a day.  3. Micardis/HCTZ 80/25 1/2 tablet daily.  4. Synthroid 100 mcg a day.  5. Aspirin 162 a day.  6. Vitamin E.  7. Vitamin C.  8. Caduet 10/80.  9. Flomax 0.4 a day.   PHYSICAL EXAMINATION:  GENERAL:  He is well-appearing in no acute  distress.  He ambulates around the clinic without any respiratory  difficulty.  Blood pressure is 128/92, which correlates exactly with his wrist cuff.  Heart rate is 74.  Weight is 194.  HEENT:  Normal.  NECK:  Supple.  No JVD.  Carotids are 2+ bilaterally without any bruits.  There is no lymphadenopathy or thyromegaly.  CARDIAC:  Regular rate and rhythm with no murmurs or rubs.  He has an  S4.  LUNGS:  Clear.  ABDOMEN:  Soft, nontender, nondistended.  No hepatosplenomegaly.  No  bruits.  No masses appreciated.  Good bowel  sounds.  EXTREMITIES:  Warm with no clubbing, cyanosis or edema.  No rash.  NEURO:  Alert and oriented x3.  Cranial nerves II-XII are intact.  Moves  all four extremities without difficulty.  Affect is pleasant.   EKG shows normal sinus rhythm at a rate of 74.  No significant ST-T wave  abnormalities.   ASSESSMENT/PLAN:  1. Hypertension:  Blood pressure is minimally elevated today,      especially the diastolic number, but those overall numbers are      quite reassuring.  Continue current therapy.  2. Hyperlipidemia:  Cholesterol numbers looked great today.  His      triglycerides are 75.  His HDL is 66 and LDL is 68.  Continue      current therapy.   DISPOSITION:  We will see him back in one year for routine followup.  At  that time, he will probably be due for his routine repeat stress  testing.  Bevelyn Buckles. Bensimhon, MD  Electronically Signed    DRB/MedQ  DD: 07/26/2007  DT: 07/26/2007  Job #: [id number]   cc:   Wallace Cullens

## 2011-01-09 NOTE — Letter (Signed)
March 10, 2006     [facility]  [address] [place].  [place], [place] [address]   RE:  Darrell Williams  MRN:  [id number]  /  DOB:  1946-04-10   Dear Loraine Leriche:   It was a pleasure to see your nice patient, Darrell Williams, for followup on  March 10, 2006.  As you know, he is a very pleasant 65 year old white male  with multiple risk factors for coronary disease without previous documented  disease.  He also has hypertension and hyperlipidemia, both of which have  been fairly well controlled.   The patient has no cardiac symptoms, generally feels quite well.  He works  as a Veterinary surgeon.  He lives part-time in [place], [place].   MEDICATIONS:  1.  K-Dur 20 b.i.d.  2.  Levoxyl.  3.  Vitamin E.  4.  Zetia 10.  5.  Aspirin 81.  6.  Micardis/hydrochlorothiazide 25/80 one-half tablet daily.  7.  Caduet 2.5/40 daily.  8.  Doxymycin.   PHYSICAL EXAMINATION:  Blood pressure 132/90, pulse 67.  Normal sinus  rhythm.  General appearance is normal.  JVP is not elevated.  Carotid pulses are  palpable equally without bruits.  LUNGS:  Clear.  CARDIAC:  Exam is unremarkable.  ABDOMEN:  Normal.  EXTREMITIES:  Normal.   The renal profile is normal.  Glucose is 111.  His SGPT is slightly elevated  at 44.  His LDL is 94.  EKG is within normal limits.   IMPRESSION:  1.  Hypertension, controlled.  2.  Hyperlipidemia, fair control.   I should note that a stress test done since his last visit revealed low-risk  Myoview.   I have reassured the patient that he appears to be doing well.  We will plan  to check the lipids and LFTs in 3 or 4 months.  I will see him back in 4  months.  Thank you for the opportunity to assist in this nice gentleman's  care.    Sincerely,      E. Graceann Congress, MD, [facility]   EJL/MedQ  DD:  03/10/2006  DT:  03/11/2006  Job #:  [id number]

## 2011-01-09 NOTE — Letter (Signed)
July 27, 2006    Wallace Cullens, MD  [address] [place]  [place], [place] [address]   RE:  DALEON, WILLINGER  MRN:  [id number]  /  DOB:  22-Jun-1946   Dear Loraine Leriche:   It was a pleasure to see your nice patient Jancarlo Biermann in follow-up  on July 27, 2006.  As you know, he is a very pleasant 65 year old  white male with multiple risk factors for coronary artery disease  without previously-documented disease.   He has hypertension and hyperlipidemia.  The lipids have been well-  controlled with a recent LDL of 66 and HDL of 70.  However, his blood  pressure has not been well-controlled with systolics in the 140s,  diastolics in the 90s at times.   He generally feels quite well and has had no cardiac symptoms.  He works  as a Veterinary surgeon, and he lives part-time in [place], [place].   Medications include K-Dur 20 mEq b.i.d., vitamin E, Zetia 10 mg,  Drixoral, Micardis/HCTZ 12.5/40 mg daily, Caduet 5/80 mg, aspirin 325  mg, Cardura 2 mg, Levoxyl 100 mcg.   PHYSICAL EXAMINATION:  VITAL SIGNS:  Blood pressure 134/96, pulse 94,  normal sinus rhythm, respirations normal.  GENERAL APPEARANCE:  Normal.  NECK:  JVP is not elevated.  Carotid pulses are palpable and equal  without bruits.  LUNGS:  Clear.  CARDIAC:  Normal. no ches pain or dyspnea.  ABDOMEN:  Normal.  EXTREMITIES:  Normal.   IMPRESSION:  1. Hypertension, only fair control.  2. Hyperlipidemia, well-controlled.   because of elevated blood pressure, which on repeat was 140/100, I have  suggested increasing the Caduet to 10/80 mg.   In addition, I should note that rather than Levoxyl, he is on Unithroid  100 mcg daily.   He is to keep a record of his blood pressures and send [place] the report.  I  will see him back in 2-3 months or p.r.n.  He should have follow-up BMP  at that time as his potassium now is 3.7.   ADDENDUM:  His EKG is normal.   Thank you or the opportunity to share in this nice gentleman's care.    Sincerely,      E. Graceann Congress, MD, [facility]  Electronically Signed    EJL/MedQ  DD: 07/27/2006  DT: 07/28/2006  Job #: [id number]

## 2011-01-14 ENCOUNTER — Other Ambulatory Visit: Payer: Self-pay | Admitting: Internal Medicine

## 2011-01-14 DIAGNOSIS — I6529 Occlusion and stenosis of unspecified carotid artery: Secondary | ICD-10-CM

## 2011-01-15 ENCOUNTER — Other Ambulatory Visit: Payer: Self-pay | Admitting: *Deleted

## 2011-01-15 ENCOUNTER — Encounter (INDEPENDENT_AMBULATORY_CARE_PROVIDER_SITE_OTHER): Payer: Medicare Other | Admitting: *Deleted

## 2011-01-15 DIAGNOSIS — I6529 Occlusion and stenosis of unspecified carotid artery: Secondary | ICD-10-CM

## 2011-01-20 ENCOUNTER — Encounter: Payer: Self-pay | Admitting: Internal Medicine

## 2011-03-02 ENCOUNTER — Encounter: Payer: Self-pay | Admitting: Internal Medicine

## 2011-03-02 NOTE — Telephone Encounter (Signed)
Error

## 2011-03-30 ENCOUNTER — Other Ambulatory Visit: Payer: Self-pay | Admitting: Internal Medicine

## 2011-03-31 ENCOUNTER — Telehealth: Payer: Self-pay | Admitting: Internal Medicine

## 2011-03-31 NOTE — Telephone Encounter (Signed)
Per pt call, pt wanting to know if he needs to get blood work done prior to pt 1 yr follow up appt. Please return pt call to advise/discuss.

## 2011-03-31 NOTE — Telephone Encounter (Signed)
I spoke with the patient. I advised that he would be due for a lipid/liver/bmp for Dr. Gala Romney. He would like to have this done prior to his f/u appointment. I will mail him an order to have this done in [place] if he decides to do that. Otherwise, he will call us back to schedule a lab appointment closer to his f/u appointment. He is agreeable.  Per the patient's request, order mailed to [address], [place], [address].

## 2011-05-05 ENCOUNTER — Other Ambulatory Visit: Payer: Self-pay | Admitting: Internal Medicine

## 2011-06-09 ENCOUNTER — Other Ambulatory Visit: Payer: Self-pay | Admitting: *Deleted

## 2011-06-09 MED ORDER — AMLODIPINE BESYLATE 10 MG PO TABS: 10.0000 mg | ORAL_TABLET | Freq: Every day | ORAL | Status: DC

## 2011-06-09 MED ORDER — EZETIMIBE 10 MG PO TABS: 10.0000 mg | ORAL_TABLET | Freq: Every day | ORAL | Status: DC

## 2011-07-08 ENCOUNTER — Ambulatory Visit (INDEPENDENT_AMBULATORY_CARE_PROVIDER_SITE_OTHER): Payer: Medicare Other | Admitting: Internal Medicine

## 2011-07-08 ENCOUNTER — Encounter: Payer: Self-pay | Admitting: Internal Medicine

## 2011-07-08 VITALS — BP 126/80 | HR 58 | Ht 72.0 in | Wt 194.0 lb

## 2011-07-08 DIAGNOSIS — I10 Essential (primary) hypertension: Secondary | ICD-10-CM

## 2011-07-08 DIAGNOSIS — I6529 Occlusion and stenosis of unspecified carotid artery: Secondary | ICD-10-CM

## 2011-07-08 DIAGNOSIS — E785 Hyperlipidemia, unspecified: Secondary | ICD-10-CM

## 2011-07-08 DIAGNOSIS — I2510 Atherosclerotic heart disease of native coronary artery without angina pectoris: Secondary | ICD-10-CM | POA: Insufficient documentation

## 2011-07-08 LAB — HEPATIC FUNCTION PANEL
ALT: 37 U/L (ref 0–53)
Albumin: 4.4 g/dL (ref 3.5–5.2)
Bilirubin, Direct: 0.2 mg/dL (ref 0.0–0.3)
Total Protein: 7.7 g/dL (ref 6.0–8.3)

## 2011-07-08 LAB — BASIC METABOLIC PANEL
BUN: 18 mg/dL (ref 6–23)
Chloride: 103 mEq/L (ref 96–112)
Potassium: 3.8 mEq/L (ref 3.5–5.1)
Sodium: 141 mEq/L (ref 135–145)

## 2011-07-08 LAB — LIPID PANEL
Cholesterol: 137 mg/dL (ref 0–200)
HDL: 70.1 mg/dL (ref >39.00)
LDL Cholesterol: 59 mg/dL (ref 0–99)
Triglycerides: 39 mg/dL (ref 0.0–149.0)
VLDL: 7.8 mg/dL (ref 0.0–40.0)

## 2011-07-08 MED ORDER — AMLODIPINE BESYLATE 10 MG PO TABS: 10.0000 mg | ORAL_TABLET | Freq: Every day | ORAL | Status: DC

## 2011-07-08 MED ORDER — EZETIMIBE 10 MG PO TABS: 10.0000 mg | ORAL_TABLET | Freq: Every day | ORAL | Status: DC

## 2011-07-08 NOTE — Progress Notes (Signed)
  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 Glennon Hamilton, returns for routine followup.  He has a history for hypertension, hyperlipidemia and allergies.  He does not have any known coronary artery disease, did have a stress test in December, 2006 and again in November 2009 which were normal.   Had carotid 5/12;  60-79% B. Unchanged. No focal neuro symptoms. Due for repeat in December.  Had cardiac CT in 10/11:   1.  Calcium score of 120 Agatston units.  This places the patient in the 55th percentile for his age and gender.  This suggests intermediate risk for future cardiac events.  2.  Mild proximal LAD stenosis (estimate around 25%).  Otherwise, no significant disease.    Overall doing fairly well. No CP or SOB. Feels legs are getting weaker. No claudication. Doesn't exercise regularly but very active walking back and forth from his home to shop. Uses a pedometer and showed he was walking several miles per day.  Notices if he doesnt take HCTZ ankles will swell. Says he has been eating lots of salty food lately and BP and edema are worse. Typical BP 120-125/80    ROS: All systems negative except as listed in HPI, PMH and Problem List.  Past Medical History  Diagnosis Date  . Hypertension   . Hyperlipidemia   . 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 amLODipine (NORVASC) 10 MG tablet Take 1 tablet (10 mg total) by mouth daily.  90 tablet  0  . atorvastatin (LIPITOR) 80 MG tablet Take 80 mg by mouth daily.        Marland Kitchen doxazosin (CARDURA) 2 MG tablet Take 2 mg by mouth at bedtime.        Marland Kitchen ezetimibe (ZETIA) 10 MG tablet Take 1 tablet (10 mg total) by mouth daily.  90 tablet  0  . hydrochlorothiazide (HYDRODIURIL) 25 MG tablet Take 25 mg by mouth daily.        Marland Kitchen levothyroxine (SYNTHROID) 100 MCG tablet Take 1 tablet (100  mcg total) by mouth daily.  90 tablet  0  . potassium chloride (KLOR-CON) 20 MEQ packet Take 20 mEq by mouth 3 (three) times daily.           PHYSICAL EXAM: Filed Vitals:   07/08/11 1149  BP: 126/80  Pulse: 58   General:  Well appearing. No resp difficulty HEENT: normal Neck: supple. JVP flat. Carotids 2+ bilaterally; no bruits. No lymphadenopathy or thryomegaly appreciated. Cor: PMI normal. Regular rate & rhythm. No rubs, gallops or murmurs. Lungs: clear Abdomen: soft, nontender, nondistended. No hepatosplenomegaly. No bruits or masses. Good bowel sounds. Extremities: no cyanosis, clubbing, rash,  1+ edema Neuro: alert & orientedx3, cranial nerves grossly intact. Moves all 4 extremities w/o difficulty. Affect pleasant.    ECG: Sinus brady 58 No ST-T wave abnormalities.     ASSESSMENT & PLAN:

## 2011-07-08 NOTE — Assessment & Plan Note (Signed)
Stable. Asymptomatic. F/u ultrasound scheduled for December.

## 2011-07-08 NOTE — Progress Notes (Signed)
Addended by: Mylo Red F on: 07/08/2011 01:00 PM   Modules accepted: Orders

## 2011-07-08 NOTE — Assessment & Plan Note (Signed)
Blood pressure well controlled. Continue current regimen.  

## 2011-07-08 NOTE — Patient Instructions (Signed)
Your physician recommends that you have lab work today fasting cholesterol and cmet.  We will call you with your results  . You will receive a reminder letter in the mail two months in advance. If you don't receive a letter, please call our office to schedule the follow-up appointment.

## 2011-07-08 NOTE — Assessment & Plan Note (Signed)
Minimal CAD on cardiac CT but given high calcium score need to be very aggressive with RF management. Encouraged regular exercise on his exercise bike. Continue high dose statin and ASA.

## 2011-07-08 NOTE — Assessment & Plan Note (Signed)
Due for lipid recheck today. Continue statin. Goal LDL < 70.

## 2011-07-10 ENCOUNTER — Telehealth: Payer: Self-pay | Admitting: Internal Medicine

## 2011-07-10 NOTE — Telephone Encounter (Signed)
Pt returning your call

## 2011-07-22 ENCOUNTER — Other Ambulatory Visit: Payer: Self-pay | Admitting: Cardiology

## 2011-07-22 DIAGNOSIS — I6529 Occlusion and stenosis of unspecified carotid artery: Secondary | ICD-10-CM

## 2011-07-27 ENCOUNTER — Encounter (INDEPENDENT_AMBULATORY_CARE_PROVIDER_SITE_OTHER): Payer: Medicare Other | Admitting: Cardiology

## 2011-07-27 DIAGNOSIS — I6529 Occlusion and stenosis of unspecified carotid artery: Secondary | ICD-10-CM

## 2011-07-29 ENCOUNTER — Encounter: Payer: Medicare Other | Admitting: Cardiology

## 2011-08-11 ENCOUNTER — Other Ambulatory Visit: Payer: Self-pay

## 2011-08-11 ENCOUNTER — Telehealth: Payer: Self-pay | Admitting: Internal Medicine

## 2011-08-11 MED ORDER — ATORVASTATIN CALCIUM 80 MG PO TABS: 80.0000 mg | ORAL_TABLET | Freq: Every day | ORAL | Status: DC

## 2011-08-11 MED ORDER — AMLODIPINE BESYLATE 10 MG PO TABS: 10.0000 mg | ORAL_TABLET | Freq: Every day | ORAL | Status: DC

## 2011-08-11 MED ORDER — EZETIMIBE 10 MG PO TABS: 10.0000 mg | ORAL_TABLET | Freq: Every day | ORAL | Status: DC

## 2011-08-11 MED ORDER — POTASSIUM CHLORIDE 20 MEQ PO PACK: 20.0000 meq | PACK | Freq: Three times a day (TID) | ORAL | Status: DC

## 2011-08-11 NOTE — Telephone Encounter (Signed)
New Problem:   Patient called in needing 90 day refills of atorvastatin (LIPITOR) 80 MG tablet, amLODipine (NORVASC) 10 MG tablet, ezetimibe (ZETIA) 10 MG tablet, potassium chloride (KLOR-CON) 20 MEQ packet [not the generic type] called or faxed into [facility] [phone]).  Please call when refill has been placed.

## 2011-08-11 NOTE — Telephone Encounter (Signed)
Pt informed meds filled.  

## 2011-08-26 ENCOUNTER — Other Ambulatory Visit: Payer: Self-pay

## 2011-08-26 ENCOUNTER — Telehealth: Payer: Self-pay | Admitting: Internal Medicine

## 2011-08-26 MED ORDER — ATORVASTATIN CALCIUM 80 MG PO TABS: 80.0000 mg | ORAL_TABLET | Freq: Every day | ORAL | Status: DC

## 2011-08-26 MED ORDER — POTASSIUM CHLORIDE CRYS ER 20 MEQ PO TBCR: 20.0000 meq | EXTENDED_RELEASE_TABLET | Freq: Two times a day (BID) | ORAL | Status: DC

## 2011-08-26 MED ORDER — EZETIMIBE 10 MG PO TABS: 10.0000 mg | ORAL_TABLET | Freq: Every day | ORAL | Status: DC

## 2011-08-26 MED ORDER — AMLODIPINE BESYLATE 10 MG PO TABS: 10.0000 mg | ORAL_TABLET | Freq: Every day | ORAL | Status: DC

## 2011-08-26 NOTE — Telephone Encounter (Signed)
New msg Pt said his refiills were not correct last month he wanted to leave request with you He was to get zetia 3 refills, Atorvastatin 80 mg 3 refills [facility] [phone] customer service [phone] Please let him know when completed

## 2011-09-24 DIAGNOSIS — H40059 Ocular hypertension, unspecified eye: Secondary | ICD-10-CM | POA: Diagnosis not present

## 2011-10-08 DIAGNOSIS — H2589 Other age-related cataract: Secondary | ICD-10-CM | POA: Diagnosis not present

## 2011-10-08 DIAGNOSIS — H40059 Ocular hypertension, unspecified eye: Secondary | ICD-10-CM | POA: Diagnosis not present

## 2011-11-23 ENCOUNTER — Telehealth (HOSPITAL_COMMUNITY): Payer: Self-pay | Admitting: *Deleted

## 2011-11-23 NOTE — Telephone Encounter (Signed)
He can stop Zetia. I would prefer he continue lipitor (rather than switching to simva) if he can afford. thanks

## 2011-11-23 NOTE — Telephone Encounter (Signed)
Darrell Williams called today.   He is in need of a refill for his Zetia, however, due to its cost, he is wondering if he can take a generic form of this medication, simvastatin instead.  He is using [facility] in [place], [phone].  He is currently out of his medication. Please call him back.

## 2011-11-23 NOTE — Telephone Encounter (Signed)
Spoke w/pt he states his Zetia is $75 for a 3 month supply and Lipitor is $85 for a 3 month supply, he was wanting to make sure he needs to continue Zetia since there is no generic, he had labs back in Nov, will send to Dr Tasia Catchings for review

## 2011-11-24 NOTE — Telephone Encounter (Signed)
Darrell Williams called back, he would like for you to call him back. Thanks.

## 2011-11-24 NOTE — Telephone Encounter (Signed)
Pt aware.

## 2011-11-24 NOTE — Telephone Encounter (Signed)
Left message to call back  

## 2011-12-02 ENCOUNTER — Other Ambulatory Visit (HOSPITAL_COMMUNITY): Payer: Self-pay | Admitting: Internal Medicine

## 2012-01-01 DIAGNOSIS — H2510 Age-related nuclear cataract, unspecified eye: Secondary | ICD-10-CM | POA: Diagnosis not present

## 2012-01-07 ENCOUNTER — Encounter (HOSPITAL_COMMUNITY): Payer: Self-pay

## 2012-01-07 ENCOUNTER — Ambulatory Visit (HOSPITAL_COMMUNITY)
Admission: RE | Admit: 2012-01-07 | Discharge: 2012-01-07 | Disposition: A | Discharge status: Home / Self Care | Payer: Medicare Other | Source: Ambulatory Visit | Attending: Internal Medicine | Admitting: Internal Medicine

## 2012-01-07 VITALS — BP 100/64 | HR 63 | Ht 72.0 in | Wt 192.8 lb

## 2012-01-07 DIAGNOSIS — E785 Hyperlipidemia, unspecified: Secondary | ICD-10-CM | POA: Insufficient documentation

## 2012-01-07 DIAGNOSIS — Z79899 Other long term (current) drug therapy: Secondary | ICD-10-CM | POA: Insufficient documentation

## 2012-01-07 DIAGNOSIS — I6529 Occlusion and stenosis of unspecified carotid artery: Secondary | ICD-10-CM | POA: Diagnosis not present

## 2012-01-07 DIAGNOSIS — I2510 Atherosclerotic heart disease of native coronary artery without angina pectoris: Secondary | ICD-10-CM | POA: Insufficient documentation

## 2012-01-07 DIAGNOSIS — I10 Essential (primary) hypertension: Secondary | ICD-10-CM | POA: Diagnosis not present

## 2012-01-07 NOTE — Assessment & Plan Note (Signed)
Stable. Asymptomatic. Due for repeat imaging.  Continue ASA and statin.

## 2012-01-07 NOTE — Assessment & Plan Note (Signed)
BP a little bit on the low side. If SBP < 110 consistently can cut amlodipine to 5mg  daily.

## 2012-01-07 NOTE — Progress Notes (Signed)
Encounter addended by: Dolores Patty, MD on: 01/07/2012  4:02 PM<BR>     Documentation filed: Notes Section

## 2012-01-07 NOTE — Progress Notes (Signed)
Encounter addended by: Noralee Space, RN on: 01/07/2012  4:11 PM<BR>     Documentation filed: Patient Instructions Section, Orders

## 2012-01-07 NOTE — Patient Instructions (Signed)
Labs in 2 months at [facility]  Your physician has requested that you have a carotid duplex. This test is an ultrasound of the carotid arteries in your neck. It looks at blood flow through these arteries that supply the brain with blood. Allow one hour for this exam. There are no restrictions or special instructions.  We will contact you in 6 months to schedule your next appointment.

## 2012-01-07 NOTE — Progress Notes (Addendum)
Patient ID: Darrell Williams, male   DOB: 07/22/1946, 66 y.o.   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 Glennon Hamilton, returns for routine followup.  He has a history for hypertension, hyperlipidemia and allergies.  He does not have any known coronary artery disease, did have a stress test in December, 2006 and again in November 2009 which were normal.   Had carotid 12/12;  60-79% B. Unchanged. No focal neuro symptoms. Due for repeat in June.  Had cardiac CT in 10/11:   1.  Calcium score of 120 Agatston units.  This places the patient in the 55th percentile for his age and gender.  This suggests intermediate risk for future cardiac events.  2.  Mild proximal LAD stenosis (estimate around 25%).  Otherwise, no significant disease.   Doing fairly well. Just got back from 10 days in [place]. Walking 3 miles per day while there. No CP or SOB. Feels legs are getting weaker. Main complaint is fatigue. Says he feels tired throughout the day. Wondering if it is jet lag. Says he is not sure if he snores. Prior to going [place] did not have prominent daytime somnolence. No headaches. No swelling. Previously was having problems affording meds so I told him to stop Zetia. Notes BP typically around 110-120.   Lab Results  Component Value Date   CHOL 137 07/08/2011   HDL 70.10 07/08/2011   LDLCALC 59 07/08/2011   TRIG 39.0 07/08/2011   CHOLHDL 2 07/08/2011     ROS: All systems negative except as listed in HPI, PMH and Problem List.  Past Medical History  Diagnosis Date  . Hypertension   . Hyperlipidemia   . 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 amLODipine (NORVASC) 10 MG tablet Take 1 tablet (10 mg total) by mouth daily.  90 tablet  2  . atorvastatin (LIPITOR) 80 MG tablet Take 1 tablet (80 mg total) by mouth daily.   90 tablet  2  . doxazosin (CARDURA) 2 MG tablet Take 2 mg by mouth at bedtime.        Marland Kitchen ezetimibe (ZETIA) 10 MG tablet Take 10 mg by mouth daily.      . hydrochlorothiazide (HYDRODIURIL) 25 MG tablet TAKE AS DIRECTED  90 tablet  4  . levothyroxine (SYNTHROID) 100 MCG tablet Take 1 tablet (100 mcg total) by mouth daily.  90 tablet  0  . potassium chloride SA (KLOR-CON M20) 20 MEQ tablet Take 1 tablet (20 mEq total) by mouth 2 (two) times daily.  180 tablet  2     PHYSICAL EXAM: Filed Vitals:   01/07/12 1537  BP: 100/64  Pulse: 63   General:  Well appearing. No resp difficulty HEENT: normal Neck: supple. JVP flat. Carotids 2+ bilaterally; no bruits. No lymphadenopathy or thryomegaly appreciated. Cor: PMI normal. Regular rate & rhythm. No rubs, gallops or murmurs. Lungs: clear but decreased throughout Abdomen: soft, nontender, nondistended. No hepatosplenomegaly. No bruits or masses. Good bowel sounds. Extremities: no cyanosis, clubbing, rash,  1+ edema Neuro: alert & orientedx3, cranial nerves grossly intact. Moves all 4 extremities w/o difficulty. Affect pleasant.   ASSESSMENT & PLAN:

## 2012-01-07 NOTE — Assessment & Plan Note (Signed)
No evidence of ischemia. Continue current regimen with aggressive risk factor management.

## 2012-01-07 NOTE — Assessment & Plan Note (Signed)
Last lipids looked good. Will stop Zetia due to cost. Continue atorva. Recheck lipids in 2 months. Goal LDL < 70.

## 2012-01-08 ENCOUNTER — Telehealth (HOSPITAL_COMMUNITY): Payer: Self-pay | Admitting: Vascular Surgery

## 2012-01-08 MED ORDER — POTASSIUM CHLORIDE CRYS ER 20 MEQ PO TBCR: 20.0000 meq | EXTENDED_RELEASE_TABLET | Freq: Three times a day (TID) | ORAL | Status: DC

## 2012-01-08 NOTE — Telephone Encounter (Signed)
Darrell Williams called about medication Klorcon-m 20 he states he is supposed to have a 3 month supply but the pharmacy will not do it until he gets a update from his doctors office. Please call him

## 2012-01-08 NOTE — Telephone Encounter (Signed)
Have sent in refill for Kdur 20 TID.  He appreciated the call back.

## 2012-02-15 DIAGNOSIS — H269 Unspecified cataract: Secondary | ICD-10-CM | POA: Diagnosis not present

## 2012-02-15 DIAGNOSIS — H2510 Age-related nuclear cataract, unspecified eye: Secondary | ICD-10-CM | POA: Diagnosis not present

## 2012-02-16 DIAGNOSIS — H2510 Age-related nuclear cataract, unspecified eye: Secondary | ICD-10-CM | POA: Diagnosis not present

## 2012-02-22 ENCOUNTER — Telehealth: Payer: Self-pay | Admitting: Internal Medicine

## 2012-02-22 NOTE — Telephone Encounter (Signed)
Spoke with pt, aware at last office visit zetia was stopped and the amlodipine was cut to 5 mg daily. Pt also wanted me to place in his chart that he recently had cataract surgery and is going to have the other eye done soon but there is a correlation between statins and cataracts, according to what he has read.

## 2012-02-22 NOTE — Telephone Encounter (Signed)
New msg Pt called and wants to know should he continue taking amlodipine. Please call

## 2012-03-08 ENCOUNTER — Other Ambulatory Visit: Payer: Self-pay | Admitting: Cardiology

## 2012-03-08 DIAGNOSIS — I6529 Occlusion and stenosis of unspecified carotid artery: Secondary | ICD-10-CM

## 2012-03-09 ENCOUNTER — Encounter (INDEPENDENT_AMBULATORY_CARE_PROVIDER_SITE_OTHER): Payer: Medicare Other

## 2012-03-09 DIAGNOSIS — I6529 Occlusion and stenosis of unspecified carotid artery: Secondary | ICD-10-CM | POA: Diagnosis not present

## 2012-03-15 DIAGNOSIS — B353 Tinea pedis: Secondary | ICD-10-CM | POA: Diagnosis not present

## 2012-03-15 DIAGNOSIS — L259 Unspecified contact dermatitis, unspecified cause: Secondary | ICD-10-CM | POA: Diagnosis not present

## 2012-04-11 DIAGNOSIS — H2510 Age-related nuclear cataract, unspecified eye: Secondary | ICD-10-CM | POA: Diagnosis not present

## 2012-04-11 DIAGNOSIS — H269 Unspecified cataract: Secondary | ICD-10-CM | POA: Diagnosis not present

## 2012-06-09 ENCOUNTER — Encounter: Payer: Self-pay | Admitting: Cardiology

## 2012-06-09 DIAGNOSIS — Z23 Encounter for immunization: Secondary | ICD-10-CM | POA: Diagnosis not present

## 2012-06-09 DIAGNOSIS — I10 Essential (primary) hypertension: Secondary | ICD-10-CM | POA: Diagnosis not present

## 2012-06-09 DIAGNOSIS — E039 Hypothyroidism, unspecified: Secondary | ICD-10-CM | POA: Diagnosis not present

## 2012-06-09 DIAGNOSIS — E7800 Pure hypercholesterolemia, unspecified: Secondary | ICD-10-CM | POA: Diagnosis not present

## 2012-06-17 ENCOUNTER — Telehealth (HOSPITAL_COMMUNITY): Payer: Self-pay | Admitting: Cardiology

## 2012-06-17 DIAGNOSIS — E782 Mixed hyperlipidemia: Secondary | ICD-10-CM

## 2012-06-17 NOTE — Telephone Encounter (Signed)
PT CALLED TO REQUEST REFILL ON MEDICATIONS, (AMLODIPINE AND ATORVASTATIN) HOWEVER NORMALLY PT WILL HAVE LABS DRAWN. ARE LABS NEEDED.  SCHEDULED APPT FOR ROUTINE FOLLOW UP ON 08/11/12 @ 1 45. IF LABS ARE NEEDED PT WOULD LIKE TO USE THE [place] OFFICE [facility] [address]

## 2012-06-20 MED ORDER — ATORVASTATIN CALCIUM 80 MG PO TABS: 80.0000 mg | ORAL_TABLET | Freq: Every day | ORAL | Status: DC

## 2012-06-20 MED ORDER — AMLODIPINE BESYLATE 10 MG PO TABS: 10.0000 mg | ORAL_TABLET | Freq: Every day | ORAL | Status: DC

## 2012-06-20 NOTE — Telephone Encounter (Signed)
Spoke w/pt refills sent in, pt will go for labs the week before appt

## 2012-06-21 DIAGNOSIS — H4011X0 Primary open-angle glaucoma, stage unspecified: Secondary | ICD-10-CM | POA: Diagnosis not present

## 2012-06-21 DIAGNOSIS — H409 Unspecified glaucoma: Secondary | ICD-10-CM | POA: Diagnosis not present

## 2012-06-27 ENCOUNTER — Other Ambulatory Visit (HOSPITAL_COMMUNITY): Payer: Self-pay | Admitting: *Deleted

## 2012-06-27 MED ORDER — POTASSIUM CHLORIDE CRYS ER 20 MEQ PO TBCR: 20.0000 meq | EXTENDED_RELEASE_TABLET | Freq: Three times a day (TID) | ORAL | Status: DC

## 2012-08-09 ENCOUNTER — Other Ambulatory Visit (INDEPENDENT_AMBULATORY_CARE_PROVIDER_SITE_OTHER): Payer: Medicare Other

## 2012-08-09 DIAGNOSIS — E782 Mixed hyperlipidemia: Secondary | ICD-10-CM

## 2012-08-09 LAB — BASIC METABOLIC PANEL
Calcium: 9.3 mg/dL (ref 8.4–10.5)
GFR: 59.85 mL/min — ABNORMAL LOW (ref >60.00)
Glucose, Bld: 108 mg/dL — ABNORMAL HIGH (ref 70–99)
Sodium: 138 mEq/L (ref 135–145)

## 2012-08-09 LAB — LIPID PANEL
HDL: 65.9 mg/dL (ref >39.00)
VLDL: 15 mg/dL (ref 0.0–40.0)

## 2012-08-09 LAB — HEPATIC FUNCTION PANEL
Albumin: 4.2 g/dL (ref 3.5–5.2)
Alkaline Phosphatase: 68 U/L (ref 39–117)
Bilirubin, Direct: 0.1 mg/dL (ref 0.0–0.3)
Total Bilirubin: 1.2 mg/dL (ref 0.3–1.2)

## 2012-08-11 ENCOUNTER — Encounter (HOSPITAL_COMMUNITY): Payer: Self-pay

## 2012-08-11 ENCOUNTER — Encounter (HOSPITAL_COMMUNITY): Payer: Medicare Other

## 2012-08-11 ENCOUNTER — Ambulatory Visit (HOSPITAL_COMMUNITY)
Admission: RE | Admit: 2012-08-11 | Discharge: 2012-08-11 | Disposition: A | Discharge status: Home / Self Care | Payer: Medicare Other | Source: Ambulatory Visit | Attending: Internal Medicine | Admitting: Internal Medicine

## 2012-08-11 VITALS — BP 112/86 | HR 64 | Wt 195.8 lb

## 2012-08-11 DIAGNOSIS — E785 Hyperlipidemia, unspecified: Secondary | ICD-10-CM | POA: Diagnosis not present

## 2012-08-11 DIAGNOSIS — R972 Elevated prostate specific antigen [PSA]: Secondary | ICD-10-CM | POA: Diagnosis not present

## 2012-08-11 DIAGNOSIS — I2510 Atherosclerotic heart disease of native coronary artery without angina pectoris: Secondary | ICD-10-CM | POA: Diagnosis not present

## 2012-08-11 DIAGNOSIS — I6529 Occlusion and stenosis of unspecified carotid artery: Secondary | ICD-10-CM | POA: Diagnosis not present

## 2012-08-11 DIAGNOSIS — I10 Essential (primary) hypertension: Secondary | ICD-10-CM

## 2012-08-11 DIAGNOSIS — N401 Enlarged prostate with lower urinary tract symptoms: Secondary | ICD-10-CM | POA: Diagnosis not present

## 2012-08-11 NOTE — Progress Notes (Addendum)
Patient ID: Darrell Williams, male   DOB: 03-28-1946, 66 y.o.   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 Glennon Hamilton, returns for routine followup.  He has a history for hypertension, hyperlipidemia and allergies.  He does not have any known coronary artery disease, did have a stress test in December, 2006 and again in November 2009 which were normal.   Had carotid 7/13;  60-79% B. Unchanged. No focal neuro symptoms. Due for repeat in January 2014  Had cardiac CT in 10/11 as part of [facility]OM[facility] trial:   1.  Calcium score of 120 Agatston units.  This places the patient in the 55th percentile for his age and gender.  This suggests intermediate risk for future cardiac events.  2.  Mild proximal LAD stenosis (estimate around 25%).  Otherwise, no significant disease.   Returns for f/u. Doing fairly well. Not as active as he used to be. 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 Occasionally gets dizzy if he misses his meds. No problems with med. No CP or dyspnea. No TIA sx.  Lab Results  Component Value Date   CHOL 195 08/09/2012   HDL 65.90 08/09/2012   LDLCALC 114* 08/09/2012   TRIG 75.0 08/09/2012   CHOLHDL 3 08/09/2012       ROS: All systems negative except as listed in HPI, PMH and Problem List.  Past Medical History  Diagnosis Date  . Hypertension   . Hyperlipidemia   . 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 amLODipine (NORVASC) 10 MG tablet Take 1 tablet (10 mg total) by mouth daily.  90 tablet  3  . atorvastatin (LIPITOR) 80 MG tablet Take 1 tablet (80 mg total) by mouth daily.  90 tablet  3  . doxazosin (CARDURA) 2 MG tablet Take 2 mg by mouth at bedtime.        . hydrochlorothiazide (HYDRODIURIL) 25 MG tablet TAKE AS DIRECTED  90 tablet  4  . levothyroxine  (SYNTHROID) 100 MCG tablet Take 1 tablet (100 mcg total) by mouth daily.  90 tablet  0  . potassium chloride SA (KLOR-CON M20) 20 MEQ tablet Take 1 tablet (20 mEq total) by mouth 3 (three) times daily.  270 tablet  3     PHYSICAL EXAM: Filed Vitals:   08/11/12 1135  BP: 112/86  Pulse: 64   Manual recheck 150/84  General:  Well appearing. No resp difficulty HEENT: normal Neck: supple. JVP flat. Carotids 2+ bilaterally; no bruits. No lymphadenopathy or thryomegaly appreciated. Cor: PMI normal. Regular rate & rhythm. No rubs or murmurs. +s4 Lungs: clear but decreased throughout Abdomen: soft, nontender, nondistended. No hepatosplenomegaly. No bruits or masses. Good bowel sounds. Extremities: no cyanosis, clubbing, rash,  tr edema Neuro: alert & orientedx3, cranial nerves grossly intact. Moves all 4 extremities w/o difficulty. Affect pleasant.   ASSESSMENT & PLAN:

## 2012-08-11 NOTE — Patient Instructions (Addendum)
Your physician has requested that you have a carotid duplex. This test is an ultrasound of the carotid arteries in your neck. It looks at blood flow through these arteries that supply the brain with blood. Allow one hour for this exam. There are no restrictions or special instructions.  Please have your cholesterol check in 3 months. [place]  Please check you blood pressure and record readings for the next 3 weeks.

## 2012-08-11 NOTE — Assessment & Plan Note (Addendum)
LDL much worse today. Says he is taking atorva regularly. His diet is horrible. Will work on getting a bit more exercise and eating a lower fat diet. Recheck in 3 months. If LDL still up can consider switch to Crestor 40.

## 2012-08-11 NOTE — Assessment & Plan Note (Signed)
Stable. Asymptomatic. Due for repeat u/s in January. Continue ASA and statin.

## 2012-08-11 NOTE — Assessment & Plan Note (Signed)
Very mild by CT. Continue RF modfication.

## 2012-08-11 NOTE — Assessment & Plan Note (Signed)
BP seems to be running low but on my manual recheck it was 150/84. He will track BP at home for 2 weeks and call us. If SBP consistently under 120 would stop amlodipine (only taking 5mg /day)

## 2012-08-23 NOTE — Addendum Note (Signed)
Encounter addended by: Theresia Bough, CMA on: 08/23/2012 12:06 PM<BR>     Documentation filed: Orders

## 2012-08-25 ENCOUNTER — Encounter (INDEPENDENT_AMBULATORY_CARE_PROVIDER_SITE_OTHER): Payer: Medicare Other

## 2012-08-25 DIAGNOSIS — I6529 Occlusion and stenosis of unspecified carotid artery: Secondary | ICD-10-CM

## 2012-10-13 DIAGNOSIS — H4011X0 Primary open-angle glaucoma, stage unspecified: Secondary | ICD-10-CM | POA: Diagnosis not present

## 2012-10-13 DIAGNOSIS — H409 Unspecified glaucoma: Secondary | ICD-10-CM | POA: Diagnosis not present

## 2012-12-28 ENCOUNTER — Other Ambulatory Visit (HOSPITAL_COMMUNITY): Payer: Self-pay | Admitting: Internal Medicine

## 2013-03-09 ENCOUNTER — Encounter (INDEPENDENT_AMBULATORY_CARE_PROVIDER_SITE_OTHER): Payer: Medicare Other

## 2013-03-09 DIAGNOSIS — I6529 Occlusion and stenosis of unspecified carotid artery: Secondary | ICD-10-CM

## 2013-03-15 ENCOUNTER — Telehealth (HOSPITAL_COMMUNITY): Payer: Self-pay | Admitting: *Deleted

## 2013-03-15 DIAGNOSIS — E782 Mixed hyperlipidemia: Secondary | ICD-10-CM

## 2013-03-15 NOTE — Telephone Encounter (Signed)
Pt is due for f/u and labs, appt sch for 8/25, he will go to [place] the week of 8/11 for labs

## 2013-03-16 DIAGNOSIS — H409 Unspecified glaucoma: Secondary | ICD-10-CM | POA: Diagnosis not present

## 2013-03-16 DIAGNOSIS — H4011X0 Primary open-angle glaucoma, stage unspecified: Secondary | ICD-10-CM | POA: Diagnosis not present

## 2013-03-28 DIAGNOSIS — L0390 Cellulitis, unspecified: Secondary | ICD-10-CM | POA: Diagnosis not present

## 2013-03-28 DIAGNOSIS — L0291 Cutaneous abscess, unspecified: Secondary | ICD-10-CM | POA: Diagnosis not present

## 2013-04-05 ENCOUNTER — Other Ambulatory Visit (INDEPENDENT_AMBULATORY_CARE_PROVIDER_SITE_OTHER): Payer: Medicare Other

## 2013-04-05 ENCOUNTER — Other Ambulatory Visit: Payer: Medicare Other

## 2013-04-05 DIAGNOSIS — E782 Mixed hyperlipidemia: Secondary | ICD-10-CM

## 2013-04-05 LAB — BASIC METABOLIC PANEL
BUN: 25 mg/dL — ABNORMAL HIGH (ref 6–23)
GFR: 48.55 mL/min — ABNORMAL LOW (ref >60.00)
Glucose, Bld: 111 mg/dL — ABNORMAL HIGH (ref 70–99)
Potassium: 3.8 mEq/L (ref 3.5–5.1)

## 2013-04-05 LAB — LIPID PANEL
Cholesterol: 172 mg/dL (ref 0–200)
VLDL: 24.6 mg/dL (ref 0.0–40.0)

## 2013-04-05 LAB — HEPATIC FUNCTION PANEL
AST: 32 U/L (ref 0–37)
Albumin: 4.2 g/dL (ref 3.5–5.2)

## 2013-04-17 ENCOUNTER — Ambulatory Visit (HOSPITAL_COMMUNITY)
Admission: RE | Admit: 2013-04-17 | Discharge: 2013-04-17 | Disposition: A | Discharge status: Home / Self Care | Payer: Medicare Other | Source: Ambulatory Visit | Attending: Internal Medicine | Admitting: Internal Medicine

## 2013-04-17 VITALS — BP 120/82 | HR 65 | Wt 195.5 lb

## 2013-04-17 DIAGNOSIS — E785 Hyperlipidemia, unspecified: Secondary | ICD-10-CM | POA: Insufficient documentation

## 2013-04-17 DIAGNOSIS — I658 Occlusion and stenosis of other precerebral arteries: Secondary | ICD-10-CM | POA: Insufficient documentation

## 2013-04-17 DIAGNOSIS — Z79899 Other long term (current) drug therapy: Secondary | ICD-10-CM | POA: Diagnosis not present

## 2013-04-17 DIAGNOSIS — I6529 Occlusion and stenosis of unspecified carotid artery: Secondary | ICD-10-CM

## 2013-04-17 DIAGNOSIS — Z9109 Other allergy status, other than to drugs and biological substances: Secondary | ICD-10-CM | POA: Insufficient documentation

## 2013-04-17 DIAGNOSIS — I10 Essential (primary) hypertension: Secondary | ICD-10-CM | POA: Diagnosis not present

## 2013-04-17 NOTE — Patient Instructions (Addendum)
Follow up 1 year with lipid profile, hepatic function, and BMET  Will get repeat US of carotids in January 2015  Call any issues [phone]

## 2013-04-17 NOTE — Progress Notes (Signed)
Patient ID: ALIZE ACY, male   DOB: 13-Aug-1946, 67 y.o.   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 Glennon Hamilton, returns for routine followup.He has a history for hypertension, hyperlipidemia and allergies.  He does not have any known coronary artery disease, did have a stress test in December, 2006 and again in November 2009 which were normal.   Had cardiac CT in 10/11 as part of PROMISE trial:   1.  Calcium score of 120 Agatston units.  This places the patient in the 55th percentile for his age and gender.  This suggests intermediate risk for future cardiac events. 2.  Mild proximal LAD stenosis (estimate around 25%).  Otherwise, no significant disease.  Labs 03/2013: Cholesterol 172, HDL 65, LDL 82, Trig 123; K+ 3.8, BUN 25, Creatinine 1.5, Total bilirubin 1.1, AST 32, ALT 33  Follow up:  Doing well. Has started taking amlodipine 5 mg am and 5 mg pm and reports BP is lower SBP 100-115/70s. Fairly active, however knows that he needs to be walking more. Denies any CP, dyspnea or headaches. Rare dizziness with changing positions to quickly.   PMH 1) HTN 2) HLD 3) Allergies 4) Carotid stenosis - 02/2012: Stable 60-79% bilateral ICA stenosis - 08/2012: Stable 40-59% RICA stenosis, 60-79% LICA stenosis - 02/2013: Stable 50-59% RICA, 60-79% LICA stenosis  Lab Results  Component Value Date   CHOL 172 04/05/2013   HDL 65.20 04/05/2013   LDLCALC 82 04/05/2013   TRIG 123.0 04/05/2013   CHOLHDL 3 04/05/2013   ROS: All systems negative except as listed in HPI, PMH and Problem List.  Past Medical History  Diagnosis Date  . Hypertension   . Hyperlipidemia   . 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 amLODipine (NORVASC) 10 MG tablet Take 5 mg by mouth 2 (two) times daily.      Marland Kitchen aspirin 81 MG tablet Take 81 mg  by mouth daily.      Marland Kitchen atorvastatin (LIPITOR) 80 MG tablet Take 1 tablet (80 mg total) by mouth daily.  90 tablet  3  . doxazosin (CARDURA) 2 MG tablet Take 2 mg by mouth at bedtime.        . hydrochlorothiazide (HYDRODIURIL) 25 MG tablet TAKE AS DIRECTED  90 tablet  3  . levothyroxine (SYNTHROID) 100 MCG tablet Take 1 tablet (100 mcg total) by mouth daily.  90 tablet  0  . potassium chloride SA (KLOR-CON M20) 20 MEQ tablet Take 1 tablet (20 mEq total) by mouth 3 (three) times daily.  270 tablet  3   No current facility-administered medications for this encounter.   Filed Vitals:   04/17/13 1511  BP: 120/82  Pulse: 65  Weight: 195 lb 8 oz (88.678 kg)  SpO2: 96%   PHYSICAL EXAM: General:  Well appearing. No resp difficulty HEENT: normal Neck: supple. JVP flat. Carotids 2+ bilaterally; no bruits. No lymphadenopathy or thryomegaly appreciated. Cor: PMI normal. Regular rate & rhythm. No rubs or murmurs. +s4 Lungs: clear but decreased throughout Abdomen: soft, nontender, nondistended. No hepatosplenomegaly. No bruits or masses. Good bowel sounds. Extremities: no cyanosis, clubbing, rash,  tr edema Neuro: alert & orientedx3, cranial nerves grossly intact. Moves all 4 extremities w/o difficulty. Affect pleasant.  ASSESSMENT & PLAN:  1) HTN - Controlled on norvasc, doxazosin, and HCTZ - Encouraged  patient to continue to exercise and watch his diet  2) HLD - Stable on statin and ASA - Will repeat lipid profile in 1 yr  3) Bilateral carotid stenosis - Stable, will repeat in 6 months.   Ulla Potash B NP-C 8:45 PM

## 2013-08-15 DIAGNOSIS — N139 Obstructive and reflux uropathy, unspecified: Secondary | ICD-10-CM | POA: Diagnosis not present

## 2013-08-15 DIAGNOSIS — N138 Other obstructive and reflux uropathy: Secondary | ICD-10-CM | POA: Diagnosis not present

## 2013-08-15 DIAGNOSIS — N401 Enlarged prostate with lower urinary tract symptoms: Secondary | ICD-10-CM | POA: Diagnosis not present

## 2013-08-16 ENCOUNTER — Other Ambulatory Visit (HOSPITAL_COMMUNITY): Payer: Self-pay | Admitting: Internal Medicine

## 2013-08-18 NOTE — Telephone Encounter (Signed)
Needs refill on Atorvastatin.  

## 2013-08-21 ENCOUNTER — Other Ambulatory Visit (HOSPITAL_COMMUNITY): Payer: Self-pay

## 2013-08-21 ENCOUNTER — Other Ambulatory Visit (HOSPITAL_COMMUNITY): Payer: Self-pay | Admitting: *Deleted

## 2013-08-21 MED ORDER — ATORVASTATIN CALCIUM 80 MG PO TABS: 80.0000 mg | ORAL_TABLET | Freq: Every day | ORAL | Status: DC

## 2013-08-21 MED ORDER — AMLODIPINE BESYLATE 10 MG PO TABS: 5.0000 mg | ORAL_TABLET | Freq: Two times a day (BID) | ORAL | Status: DC

## 2013-08-21 MED ORDER — DOXAZOSIN MESYLATE 2 MG PO TABS: 2.0000 mg | ORAL_TABLET | Freq: Every day | ORAL | Status: DC

## 2013-08-21 MED ORDER — ATORVASTATIN CALCIUM 80 MG PO TABS: ORAL_TABLET | ORAL | Status: DC

## 2013-08-21 MED ORDER — KLOR-CON M20 20 MEQ PO TBCR: 20.0000 meq | EXTENDED_RELEASE_TABLET | Freq: Three times a day (TID) | ORAL | Status: DC

## 2013-08-21 MED ORDER — HYDROCHLOROTHIAZIDE 25 MG PO TABS: ORAL_TABLET | ORAL | Status: DC

## 2013-08-22 DIAGNOSIS — R972 Elevated prostate specific antigen [PSA]: Secondary | ICD-10-CM | POA: Diagnosis not present

## 2013-08-22 DIAGNOSIS — N139 Obstructive and reflux uropathy, unspecified: Secondary | ICD-10-CM | POA: Diagnosis not present

## 2013-08-22 DIAGNOSIS — N401 Enlarged prostate with lower urinary tract symptoms: Secondary | ICD-10-CM | POA: Diagnosis not present

## 2013-08-22 DIAGNOSIS — N529 Male erectile dysfunction, unspecified: Secondary | ICD-10-CM | POA: Diagnosis not present

## 2013-08-23 DIAGNOSIS — L821 Other seborrheic keratosis: Secondary | ICD-10-CM | POA: Diagnosis not present

## 2013-09-05 DIAGNOSIS — Z79899 Other long term (current) drug therapy: Secondary | ICD-10-CM | POA: Diagnosis not present

## 2013-09-13 DIAGNOSIS — H4010X0 Unspecified open-angle glaucoma, stage unspecified: Secondary | ICD-10-CM | POA: Diagnosis not present

## 2013-09-13 DIAGNOSIS — H4011X0 Primary open-angle glaucoma, stage unspecified: Secondary | ICD-10-CM | POA: Diagnosis not present

## 2014-02-15 ENCOUNTER — Telehealth (HOSPITAL_COMMUNITY): Payer: Self-pay | Admitting: Vascular Surgery

## 2014-02-15 DIAGNOSIS — I6529 Occlusion and stenosis of unspecified carotid artery: Secondary | ICD-10-CM

## 2014-02-15 NOTE — Telephone Encounter (Signed)
Pt states he has a yearly Carotid doppler he hasnt been called to get that scheduled. I scheduled him for his yearly late July. Please advise

## 2014-02-15 NOTE — Telephone Encounter (Signed)
Pt is due for yearly carotid, [place] I put order in can you please schedule for [facility]

## 2014-02-16 ENCOUNTER — Other Ambulatory Visit (HOSPITAL_COMMUNITY): Payer: Self-pay | Admitting: Internal Medicine

## 2014-02-16 DIAGNOSIS — I6523 Occlusion and stenosis of bilateral carotid arteries: Secondary | ICD-10-CM

## 2014-02-16 NOTE — Telephone Encounter (Signed)
Carotids scheduled

## 2014-02-20 ENCOUNTER — Ambulatory Visit (HOSPITAL_COMMUNITY): Payer: Medicare Other | Attending: Internal Medicine | Admitting: Cardiology

## 2014-02-20 DIAGNOSIS — I6529 Occlusion and stenosis of unspecified carotid artery: Secondary | ICD-10-CM | POA: Diagnosis not present

## 2014-02-20 NOTE — Progress Notes (Signed)
Carotid duplex performed 

## 2014-03-20 ENCOUNTER — Ambulatory Visit: Payer: Medicare Other

## 2014-03-20 ENCOUNTER — Other Ambulatory Visit (HOSPITAL_COMMUNITY): Payer: Self-pay | Admitting: Cardiology

## 2014-03-20 DIAGNOSIS — E785 Hyperlipidemia, unspecified: Secondary | ICD-10-CM

## 2014-03-20 DIAGNOSIS — I10 Essential (primary) hypertension: Secondary | ICD-10-CM

## 2014-03-20 LAB — LIPID PANEL
CHOL/HDL RATIO: 3
Cholesterol: 187 mg/dL (ref 0–200)
HDL: 59.4 mg/dL (ref >39.00)
LDL Cholesterol: 104 mg/dL — ABNORMAL HIGH (ref 0–99)
NONHDL: 127.6
Triglycerides: 119 mg/dL (ref 0.0–149.0)
VLDL: 23.8 mg/dL (ref 0.0–40.0)

## 2014-03-20 LAB — BASIC METABOLIC PANEL
BUN: 21 mg/dL (ref 6–23)
CO2: 28 meq/L (ref 19–32)
Calcium: 9.3 mg/dL (ref 8.4–10.5)
Chloride: 106 mEq/L (ref 96–112)
Creatinine, Ser: 1.6 mg/dL — ABNORMAL HIGH (ref 0.4–1.5)
GFR: 45.96 mL/min — ABNORMAL LOW (ref >60.00)
Glucose, Bld: 104 mg/dL — ABNORMAL HIGH (ref 70–99)
Potassium: 4.3 mEq/L (ref 3.5–5.1)
SODIUM: 140 meq/L (ref 135–145)

## 2014-03-20 LAB — HEPATIC FUNCTION PANEL
ALK PHOS: 77 U/L (ref 39–117)
ALT: 18 U/L (ref 0–53)
AST: 22 U/L (ref 0–37)
Albumin: 4.2 g/dL (ref 3.5–5.2)
BILIRUBIN DIRECT: 0.2 mg/dL (ref 0.0–0.3)
Total Bilirubin: 1.3 mg/dL — ABNORMAL HIGH (ref 0.2–1.2)
Total Protein: 7.4 g/dL (ref 6.0–8.3)

## 2014-03-22 ENCOUNTER — Ambulatory Visit (HOSPITAL_COMMUNITY)
Admission: RE | Admit: 2014-03-22 | Discharge: 2014-03-22 | Disposition: A | Discharge status: Home / Self Care | Payer: Medicare Other | Source: Ambulatory Visit | Attending: Internal Medicine | Admitting: Internal Medicine

## 2014-03-22 ENCOUNTER — Encounter (HOSPITAL_COMMUNITY): Payer: Self-pay

## 2014-03-22 VITALS — BP 112/80 | HR 72 | Wt 201.8 lb

## 2014-03-22 DIAGNOSIS — R5383 Other fatigue: Secondary | ICD-10-CM

## 2014-03-22 DIAGNOSIS — R5381 Other malaise: Secondary | ICD-10-CM | POA: Insufficient documentation

## 2014-03-22 LAB — CBC
HCT: 49.2 % (ref 39.0–52.0)
HEMOGLOBIN: 16.6 g/dL (ref 13.0–17.0)
MCH: 31.1 pg (ref 26.0–34.0)
MCHC: 33.7 g/dL (ref 30.0–36.0)
MCV: 92.1 fL (ref 78.0–100.0)
Platelets: 191 10*3/uL (ref 150–400)
RBC: 5.34 MIL/uL (ref 4.22–5.81)
RDW: 14.3 % (ref 11.5–15.5)
WBC: 3.9 10*3/uL — ABNORMAL LOW (ref 4.0–10.5)

## 2014-03-22 LAB — TSH: TSH: 3.55 u[IU]/mL (ref 0.350–4.500)

## 2014-03-22 NOTE — Patient Instructions (Signed)
Follow up in 1 year.

## 2014-03-23 ENCOUNTER — Telehealth (HOSPITAL_COMMUNITY): Payer: Self-pay | Admitting: Cardiology

## 2014-03-23 NOTE — Telephone Encounter (Signed)
Message copied by JEFFRIES, Sharlot Gowda on Fri Mar 23, 2014  9:40 AM ------      Message from: Ramona, [place] D      Created: Fri Mar 23, 2014  9:17 AM       Please call TSH ok. CBC ok. Please send a copy to Dr Lovette Cliche in [place] ------

## 2014-03-23 NOTE — Telephone Encounter (Signed)
Pt aware of lab reuslts copy sent Dr.Redding and patient as requested

## 2014-06-08 DIAGNOSIS — H4011X1 Primary open-angle glaucoma, mild stage: Secondary | ICD-10-CM | POA: Diagnosis not present

## 2014-06-08 DIAGNOSIS — H1045 Other chronic allergic conjunctivitis: Secondary | ICD-10-CM | POA: Diagnosis not present

## 2014-06-15 DIAGNOSIS — H1045 Other chronic allergic conjunctivitis: Secondary | ICD-10-CM | POA: Diagnosis not present

## 2014-06-22 DIAGNOSIS — K7689 Other specified diseases of liver: Secondary | ICD-10-CM | POA: Diagnosis not present

## 2014-06-22 DIAGNOSIS — E780 Pure hypercholesterolemia: Secondary | ICD-10-CM | POA: Diagnosis not present

## 2014-06-22 DIAGNOSIS — Z7982 Long term (current) use of aspirin: Secondary | ICD-10-CM | POA: Diagnosis not present

## 2014-06-22 DIAGNOSIS — I10 Essential (primary) hypertension: Secondary | ICD-10-CM | POA: Diagnosis not present

## 2014-06-22 DIAGNOSIS — R109 Unspecified abdominal pain: Secondary | ICD-10-CM | POA: Diagnosis not present

## 2014-06-22 DIAGNOSIS — N200 Calculus of kidney: Secondary | ICD-10-CM | POA: Diagnosis not present

## 2014-06-22 DIAGNOSIS — N132 Hydronephrosis with renal and ureteral calculous obstruction: Secondary | ICD-10-CM | POA: Diagnosis not present

## 2014-06-22 DIAGNOSIS — Z79899 Other long term (current) drug therapy: Secondary | ICD-10-CM | POA: Diagnosis not present

## 2014-06-22 DIAGNOSIS — R112 Nausea with vomiting, unspecified: Secondary | ICD-10-CM | POA: Diagnosis not present

## 2014-06-22 DIAGNOSIS — R1031 Right lower quadrant pain: Secondary | ICD-10-CM | POA: Diagnosis not present

## 2014-06-28 DIAGNOSIS — N201 Calculus of ureter: Secondary | ICD-10-CM | POA: Diagnosis not present

## 2014-07-12 ENCOUNTER — Other Ambulatory Visit: Payer: Self-pay | Admitting: Urology

## 2014-07-12 DIAGNOSIS — N201 Calculus of ureter: Secondary | ICD-10-CM | POA: Diagnosis not present

## 2014-07-12 DIAGNOSIS — R972 Elevated prostate specific antigen [PSA]: Secondary | ICD-10-CM | POA: Diagnosis not present

## 2014-07-13 ENCOUNTER — Other Ambulatory Visit (HOSPITAL_COMMUNITY): Payer: Self-pay | Admitting: *Deleted

## 2014-07-13 ENCOUNTER — Encounter (HOSPITAL_BASED_OUTPATIENT_CLINIC_OR_DEPARTMENT_OTHER): Payer: Self-pay | Admitting: *Deleted

## 2014-07-13 DIAGNOSIS — I6523 Occlusion and stenosis of bilateral carotid arteries: Secondary | ICD-10-CM

## 2014-07-16 ENCOUNTER — Encounter (HOSPITAL_BASED_OUTPATIENT_CLINIC_OR_DEPARTMENT_OTHER): Payer: Self-pay | Admitting: *Deleted

## 2014-07-16 NOTE — Progress Notes (Signed)
   07/16/14 1144  OBSTRUCTIVE SLEEP APNEA  Have you ever been diagnosed with sleep apnea through a sleep study? No  Do you snore loudly (loud enough to be heard through closed doors)?  0  Do you often feel tired, fatigued, or sleepy during the daytime? 0  Has anyone observed you stop breathing during your sleep? 0  Do you have, or are you being treated for high blood pressure? 1  BMI more than 35 kg/m2? 0  Age over 70 years old? 1  Neck circumference greater than 40 cm/16 inches? 1  Gender: 1  Obstructive Sleep Apnea Score 4  Score 4 or greater  Results sent to PCP

## 2014-07-16 NOTE — Progress Notes (Addendum)
NPO AFTER MN. ARRIVE AT 0830. NEEDS ISTAT AND EKG. WILL TAKE NORVASC AND LIPITOR  AM DOS W/ SIPS OF WATER.

## 2014-07-23 ENCOUNTER — Encounter (HOSPITAL_BASED_OUTPATIENT_CLINIC_OR_DEPARTMENT_OTHER)
Admission: RE | Disposition: A | Discharge status: Home / Self Care | Payer: Self-pay | Source: Ambulatory Visit | Attending: Urology

## 2014-07-23 ENCOUNTER — Encounter (HOSPITAL_BASED_OUTPATIENT_CLINIC_OR_DEPARTMENT_OTHER): Payer: Self-pay | Admitting: Anesthesiology

## 2014-07-23 ENCOUNTER — Ambulatory Visit (HOSPITAL_BASED_OUTPATIENT_CLINIC_OR_DEPARTMENT_OTHER): Payer: Medicare Other | Admitting: Anesthesiology

## 2014-07-23 ENCOUNTER — Ambulatory Visit (HOSPITAL_BASED_OUTPATIENT_CLINIC_OR_DEPARTMENT_OTHER)
Admission: RE | Admit: 2014-07-23 | Discharge: 2014-07-23 | Disposition: A | Discharge status: Home / Self Care | Payer: Medicare Other | Source: Ambulatory Visit | Attending: Urology | Admitting: Urology

## 2014-07-23 DIAGNOSIS — R0602 Shortness of breath: Secondary | ICD-10-CM | POA: Insufficient documentation

## 2014-07-23 DIAGNOSIS — Z885 Allergy status to narcotic agent status: Secondary | ICD-10-CM | POA: Insufficient documentation

## 2014-07-23 DIAGNOSIS — Z818 Family history of other mental and behavioral disorders: Secondary | ICD-10-CM | POA: Diagnosis not present

## 2014-07-23 DIAGNOSIS — R05 Cough: Secondary | ICD-10-CM | POA: Diagnosis not present

## 2014-07-23 DIAGNOSIS — E0590 Thyrotoxicosis, unspecified without thyrotoxic crisis or storm: Secondary | ICD-10-CM | POA: Insufficient documentation

## 2014-07-23 DIAGNOSIS — N23 Unspecified renal colic: Secondary | ICD-10-CM | POA: Insufficient documentation

## 2014-07-23 DIAGNOSIS — R351 Nocturia: Secondary | ICD-10-CM | POA: Insufficient documentation

## 2014-07-23 DIAGNOSIS — Z888 Allergy status to other drugs, medicaments and biological substances status: Secondary | ICD-10-CM | POA: Diagnosis not present

## 2014-07-23 DIAGNOSIS — N201 Calculus of ureter: Secondary | ICD-10-CM | POA: Diagnosis not present

## 2014-07-23 DIAGNOSIS — Z8679 Personal history of other diseases of the circulatory system: Secondary | ICD-10-CM | POA: Diagnosis not present

## 2014-07-23 DIAGNOSIS — N529 Male erectile dysfunction, unspecified: Secondary | ICD-10-CM | POA: Diagnosis not present

## 2014-07-23 DIAGNOSIS — Z8719 Personal history of other diseases of the digestive system: Secondary | ICD-10-CM | POA: Insufficient documentation

## 2014-07-23 DIAGNOSIS — Z841 Family history of disorders of kidney and ureter: Secondary | ICD-10-CM | POA: Diagnosis not present

## 2014-07-23 DIAGNOSIS — R011 Cardiac murmur, unspecified: Secondary | ICD-10-CM | POA: Insufficient documentation

## 2014-07-23 DIAGNOSIS — Z8249 Family history of ischemic heart disease and other diseases of the circulatory system: Secondary | ICD-10-CM | POA: Insufficient documentation

## 2014-07-23 DIAGNOSIS — N200 Calculus of kidney: Secondary | ICD-10-CM | POA: Insufficient documentation

## 2014-07-23 DIAGNOSIS — Z8639 Personal history of other endocrine, nutritional and metabolic disease: Secondary | ICD-10-CM | POA: Insufficient documentation

## 2014-07-23 DIAGNOSIS — M549 Dorsalgia, unspecified: Secondary | ICD-10-CM | POA: Insufficient documentation

## 2014-07-23 HISTORY — DX: Frequency of micturition: R35.0

## 2014-07-23 HISTORY — DX: Unspecified glaucoma: H40.9

## 2014-07-23 HISTORY — DX: Occlusion and stenosis of bilateral carotid arteries: I65.23

## 2014-07-23 HISTORY — DX: Calculus of kidney: N20.0

## 2014-07-23 HISTORY — DX: Atherosclerotic heart disease of native coronary artery without angina pectoris: I25.10

## 2014-07-23 HISTORY — DX: Calculus of ureter: N20.1

## 2014-07-23 HISTORY — DX: Benign prostatic hyperplasia without lower urinary tract symptoms: N40.0

## 2014-07-23 HISTORY — DX: Other specified personal risk factors, not elsewhere classified: Z91.89

## 2014-07-23 HISTORY — PX: CYSTOSCOPY WITH RETROGRADE PYELOGRAM, URETEROSCOPY AND STENT PLACEMENT: SHX5789

## 2014-07-23 HISTORY — DX: Presence of spectacles and contact lenses: Z97.3

## 2014-07-23 HISTORY — PX: HOLMIUM LASER APPLICATION: SHX5852

## 2014-07-23 HISTORY — DX: Urgency of urination: R39.15

## 2014-07-23 LAB — POCT I-STAT 4, (NA,K, GLUC, HGB,HCT)
GLUCOSE: 114 mg/dL — AB (ref 70–99)
HEMATOCRIT: 46 % (ref 39.0–52.0)
Hemoglobin: 15.6 g/dL (ref 13.0–17.0)
Potassium: 5.2 mEq/L (ref 3.7–5.3)
SODIUM: 138 meq/L (ref 137–147)

## 2014-07-23 SURGERY — CYSTOURETEROSCOPY, WITH RETROGRADE PYELOGRAM AND STENT INSERTION
Anesthesia: General | Site: Ureter | Laterality: Bilateral

## 2014-07-23 MED ORDER — OXYCODONE HCL 5 MG PO TABS
ORAL_TABLET | ORAL | Status: AC
  Filled 2014-07-23: qty 1

## 2014-07-23 MED ORDER — DEXAMETHASONE SODIUM PHOSPHATE 4 MG/ML IJ SOLN
INTRAMUSCULAR | Status: DC | PRN
  Administered 2014-07-23: 10 mg via INTRAVENOUS

## 2014-07-23 MED ORDER — SUCCINYLCHOLINE CHLORIDE 20 MG/ML IJ SOLN
INTRAMUSCULAR | Status: DC | PRN
  Administered 2014-07-23: 100 mg via INTRAVENOUS

## 2014-07-23 MED ORDER — GLYCOPYRROLATE 0.2 MG/ML IJ SOLN
INTRAMUSCULAR | Status: DC | PRN
  Administered 2014-07-23: 0.2 mg via INTRAVENOUS
  Administered 2014-07-23: 0.6 mg via INTRAVENOUS

## 2014-07-23 MED ORDER — NEOSTIGMINE METHYLSULFATE 10 MG/10ML IV SOLN
INTRAVENOUS | Status: DC | PRN
  Administered 2014-07-23: 4 mg via INTRAVENOUS

## 2014-07-23 MED ORDER — EPHEDRINE SULFATE 50 MG/ML IJ SOLN
INTRAMUSCULAR | Status: DC | PRN
  Administered 2014-07-23 (×3): 10 mg via INTRAVENOUS

## 2014-07-23 MED ORDER — IOHEXOL 350 MG/ML SOLN
INTRAVENOUS | Status: DC | PRN
  Administered 2014-07-23: 6 mL

## 2014-07-23 MED ORDER — METOCLOPRAMIDE HCL 5 MG/ML IJ SOLN
INTRAMUSCULAR | Status: DC | PRN
  Administered 2014-07-23: 10 mg via INTRAVENOUS

## 2014-07-23 MED ORDER — LACTATED RINGERS IV SOLN
INTRAVENOUS | Status: DC
  Administered 2014-07-23 (×2): via INTRAVENOUS
  Filled 2014-07-23: qty 1000

## 2014-07-23 MED ORDER — OXYCODONE HCL 5 MG PO TABS
5.0000 mg | ORAL_TABLET | Freq: Four times a day (QID) | ORAL | Status: DC | PRN
  Administered 2014-07-23: 5 mg via ORAL
  Filled 2014-07-23: qty 1

## 2014-07-23 MED ORDER — PROPOFOL 10 MG/ML IV BOLUS
INTRAVENOUS | Status: DC | PRN
  Administered 2014-07-23: 30 mg via INTRAVENOUS
  Administered 2014-07-23: 170 mg via INTRAVENOUS

## 2014-07-23 MED ORDER — ACETAMINOPHEN 10 MG/ML IV SOLN
INTRAVENOUS | Status: DC | PRN
  Administered 2014-07-23: 1000 mg via INTRAVENOUS

## 2014-07-23 MED ORDER — FENTANYL CITRATE 0.05 MG/ML IJ SOLN
INTRAMUSCULAR | Status: DC | PRN
  Administered 2014-07-23 (×4): 50 ug via INTRAVENOUS

## 2014-07-23 MED ORDER — URELLE 81 MG PO TABS
1.0000 | ORAL_TABLET | Freq: Four times a day (QID) | ORAL | Status: DC
  Administered 2014-07-23: 81 mg via ORAL
  Filled 2014-07-23: qty 1

## 2014-07-23 MED ORDER — CEFAZOLIN SODIUM-DEXTROSE 2-3 GM-% IV SOLR
2.0000 g | INTRAVENOUS | Status: AC
  Administered 2014-07-23: 2 g via INTRAVENOUS
  Filled 2014-07-23: qty 50

## 2014-07-23 MED ORDER — SODIUM CHLORIDE 0.9 % IR SOLN
Status: DC | PRN
  Administered 2014-07-23: 6000 mL
  Administered 2014-07-23: 500 mL

## 2014-07-23 MED ORDER — PROMETHAZINE HCL 25 MG/ML IJ SOLN
6.2500 mg | INTRAMUSCULAR | Status: DC | PRN
  Filled 2014-07-23: qty 1

## 2014-07-23 MED ORDER — FENTANYL CITRATE 0.05 MG/ML IJ SOLN
INTRAMUSCULAR | Status: AC
  Filled 2014-07-23: qty 4

## 2014-07-23 MED ORDER — URELLE 81 MG PO TABS
ORAL_TABLET | ORAL | Status: AC
  Filled 2014-07-23: qty 1

## 2014-07-23 MED ORDER — OXYCODONE-ACETAMINOPHEN 5-325 MG PO TABS: 1.0000 | ORAL_TABLET | Freq: Four times a day (QID) | ORAL | Status: DC | PRN

## 2014-07-23 MED ORDER — ROCURONIUM BROMIDE 100 MG/10ML IV SOLN
INTRAVENOUS | Status: DC | PRN
  Administered 2014-07-23: 5 mg via INTRAVENOUS
  Administered 2014-07-23: 30 mg via INTRAVENOUS

## 2014-07-23 MED ORDER — URIBEL 118 MG PO CAPS: 1.0000 | ORAL_CAPSULE | Freq: Three times a day (TID) | ORAL | Status: DC | PRN

## 2014-07-23 MED ORDER — LIDOCAINE HCL (CARDIAC) 20 MG/ML IV SOLN
INTRAVENOUS | Status: DC | PRN
  Administered 2014-07-23: 60 mg via INTRAVENOUS
  Administered 2014-07-23: 40 mg via INTRAVENOUS

## 2014-07-23 MED ORDER — LIDOCAINE HCL 2 % EX GEL
CUTANEOUS | Status: DC | PRN
  Administered 2014-07-23: 1 via URETHRAL

## 2014-07-23 MED ORDER — FENTANYL CITRATE 0.05 MG/ML IJ SOLN
25.0000 ug | INTRAMUSCULAR | Status: DC | PRN
  Filled 2014-07-23: qty 1

## 2014-07-23 MED ORDER — ONDANSETRON HCL 4 MG/2ML IJ SOLN
INTRAMUSCULAR | Status: DC | PRN
  Administered 2014-07-23: 4 mg via INTRAVENOUS

## 2014-07-23 MED ORDER — CEFAZOLIN SODIUM-DEXTROSE 2-3 GM-% IV SOLR
INTRAVENOUS | Status: AC
  Filled 2014-07-23: qty 50

## 2014-07-23 SURGICAL SUPPLY — 29 items
ADAPTER CATH URET PLST 4-6FR (CATHETERS) IMPLANT
ADPR CATH URET STRL DISP 4-6FR (CATHETERS)
BAG DRAIN URO-CYSTO SKYTR STRL (DRAIN) ×3 IMPLANT
BAG DRN UROCATH (DRAIN) ×1
BASKET LASER NITINOL 1.9FR (BASKET) IMPLANT
BASKET ZERO TIP NITINOL 2.4FR (BASKET) ×3 IMPLANT
BSKT STON RTRVL 120 1.9FR (BASKET)
BSKT STON RTRVL ZERO TP 2.4FR (BASKET) ×1
CANISTER SUCT LVC 12 LTR MEDI- (MISCELLANEOUS) ×2 IMPLANT
CATH INTERMIT  6FR 70CM (CATHETERS) ×2 IMPLANT
CLOTH BEACON ORANGE TIMEOUT ST (SAFETY) ×3 IMPLANT
DRAPE CAMERA CLOSED 9X96 (DRAPES) ×3 IMPLANT
DRSG TEGADERM 2-3/8X2-3/4 SM (GAUZE/BANDAGES/DRESSINGS) IMPLANT
FIBER LASER TRAC TIP (UROLOGICAL SUPPLIES) ×2 IMPLANT
GLOVE BIO SURGEON STRL SZ7 (GLOVE) ×2 IMPLANT
GLOVE BIO SURGEON STRL SZ7.5 (GLOVE) ×3 IMPLANT
GLOVE BIOGEL PI IND STRL 7.0 (GLOVE) IMPLANT
GLOVE BIOGEL PI INDICATOR 7.0 (GLOVE) ×4
GOWN STRL REUS W/ TWL LRG LVL3 (GOWN DISPOSABLE) IMPLANT
GOWN STRL REUS W/ TWL XL LVL3 (GOWN DISPOSABLE) IMPLANT
GOWN STRL REUS W/TWL LRG LVL3 (GOWN DISPOSABLE) ×3
GOWN STRL REUS W/TWL XL LVL3 (GOWN DISPOSABLE) ×3
GUIDEWIRE STR DUAL SENSOR (WIRE) ×2 IMPLANT
IV NS IRRIG 3000ML ARTHROMATIC (IV SOLUTION) ×6 IMPLANT
NS IRRIG 500ML POUR BTL (IV SOLUTION) ×2 IMPLANT
PACK CYSTO (CUSTOM PROCEDURE TRAY) ×3 IMPLANT
SHEATH ACCESS URETERAL 38CM (SHEATH) ×2 IMPLANT
STENT URET 6FRX24 CONTOUR (STENTS) ×4 IMPLANT
SYRINGE IRR TOOMEY STRL 70CC (SYRINGE) ×2 IMPLANT

## 2014-07-23 NOTE — Anesthesia Preprocedure Evaluation (Addendum)
Anesthesia Evaluation  Patient identified by MRN, date of birth, ID band Patient awake    Reviewed: Allergy & Precautions, H&P , NPO status , Patient's Chart, lab work & pertinent test results  Airway Mallampati: II  TM Distance: >3 FB Neck ROM: Full    Dental no notable dental hx.    Pulmonary shortness of breath and with exertion, former smoker,  Chronic cough which he attributes to GERD and amlodipine. He is considering reflux surgery next year. breath sounds clear to auscultation  Pulmonary exam normal       Cardiovascular Exercise Tolerance: Good hypertension, Pt. on medications + CAD and + Peripheral Vascular Disease Rhythm:Regular Rate:Normal     Neuro/Psych negative neurological ROS  negative psych ROS   GI/Hepatic negative GI ROS, Neg liver ROS,   Endo/Other  negative endocrine ROS  Renal/GU negative Renal ROS  negative genitourinary   Musculoskeletal negative musculoskeletal ROS (+)   Abdominal   Peds negative pediatric ROS (+)  Hematology negative hematology ROS (+)   Anesthesia Other Findings   Reproductive/Obstetrics negative OB ROS                          Anesthesia Physical Anesthesia Plan  ASA: III  Anesthesia Plan: General   Post-op Pain Management:    Induction: Intravenous  Airway Management Planned: Oral ETT  Additional Equipment:   Intra-op Plan:   Post-operative Plan: Extubation in OR  Informed Consent: I have reviewed the patients History and Physical, chart, labs and discussed the procedure including the risks, benefits and alternatives for the proposed anesthesia with the patient or authorized representative who has indicated his/her understanding and acceptance.   Dental advisory given  Plan Discussed with: CRNA  Anesthesia Plan Comments:        Anesthesia Quick Evaluation

## 2014-07-23 NOTE — Interval H&P Note (Signed)
History and Physical Interval Note:  07/23/2014 10:03 AM  Darrell Williams  has presented today for surgery, with the diagnosis of BILATERAL URETERAL CALCULI  The various methods of treatment have been discussed with the patient and family. After consideration of risks, benefits and other options for treatment, the patient has consented to  Procedure(s): CYSTOSCOPY WITH BILATERAL RETROGRADE PYELOGRAM, POSSIBLE URETEROSCOPY AND STENT PLACEMENT (Bilateral) HOLMIUM LASER APPLICATION (Bilateral) as a surgical intervention .  The patient's history has been reviewed, patient examined, no change in status, stable for surgery.  I have reviewed the patient's chart and labs.  Questions were answered to the patient's satisfaction.     [name] S

## 2014-07-23 NOTE — H&P (Signed)
History of Present Illness   Darrell Williams presents today for further assessment of his right mid ureteral calculus and questionable left ureteral stone. He has been followed in our office primarily for issues with regard to voiding and elevated PSA with prior history of negative biopsies. He was last seen here in December of last year for routine assessment and was scheduled to see me last month for his typical annual followup. He has no prior history of nephrolithiasis. He developed sudden onset of fairly classic right-sided renal colic and was diagnosed with a 7 mm proximal right ureteral stone causing some hydronephrosis. A CD disc was sent to our office. I reviewed that today and compared that with KUB from several weeks ago as well as KUB today.   It does appear that he had an obstructing 7 mm proximal to mid right ureteral stone. On KUB several weeks ago, it was very difficult to visualize a stone. Today it appears that the stone is at the junction of the proximal and mid ureter, very adjacent to a vertebral body. It appears to measure about 7 mm and does not appear to have made much progress. In addition, a very suspicious 8 mm calcification was also noted in the left hemipelvis. This appears to be adjacent to or within the ureter on the left, but very difficult to determine if this is a definitive ureteral stone, given the lack of obstruction on the left side. He has not had any symptoms on the left. There were also renal calculi on the left. Again, no prior history of nephrolithiasis. He has done fairly well clinically but, again, it has been approximately 3 weeks without definitive evidence of progression. He is on alpha-blocker therapy.      Past Medical History Problems  1. History of esophageal reflux (Z87.19) 2. History of hypercholesterolemia (Z86.39) 3. History of hypertension (Z86.79) 4. History of Hyperthyroidism (E05.90) 5. History of Murmur (R01.1)  Surgical History Problems  1.  History of Inguinal Hernia Repair  Current Meds 1. AmLODIPine Besylate 5 MG Oral Tablet;  Therapy: 03Aug2011 to Recorded 2. Aspirin 81 MG Oral Tablet; 1 per day;  Therapy: (Recorded:24May2012) to Recorded 3. Atorvastatin Calcium 80 MG Oral Tablet;  Therapy: (Recorded:05Nov2015) to Recorded 4. Doxazosin Mesylate 4 MG Oral Tablet; Take one-half tablet by  mouth daily;  Therapy: [id number] to (Evaluate:22Dec2015)  Requested for: 27Mar2015; Last  Rx:27Mar2015 Ordered 5. Ibuprofen TABS; 200mg ; 1 per day;  Therapy: (Recorded:24May2012) to Recorded 6. K-Dur TBCR; TAKE 1 TABLET TWICE DAILY;  Therapy: (Recorded:24May2012) to Recorded 7. Ketorolac Tromethamine 10 MG Oral Tablet;  Therapy: (Recorded:05Nov2015) to Recorded 8. Loratadine TABS; 10mg ; 1 per day;  Therapy: (Recorded:24May2012) to Recorded 9. Melatonin 3 MG Oral Tablet; 1 per day;  Therapy: (Recorded:24May2012) to Recorded 10. Multi-Day Vitamins TABS; TAKE 1 TABLET DAILY;   Therapy: (Recorded:24May2012) to Recorded 11. OxyCODONE HCl - 5 MG Oral Capsule;   Therapy: (Recorded:05Nov2015) to Recorded 12. Oxycodone-Acetaminophen 5-325 MG Oral Tablet; TAKE 1 TO 2 TABLETS EVERY 4 TO 6   HOURS AS NEEDED;   Therapy: [id number] to (Evaluate:08Nov2015); Last Rx:05Nov2015 Ordered 13. Tamsulosin HCl 0.4 MG CP24;   Therapy: (Recorded:05Nov2015) to Recorded 14. Vitamin B Complex CAPS; 1 per day;   Therapy: (Recorded:24May2012) to Recorded 15. Vitamin C TABS; 500mg ; 1 per day;   Therapy: (Recorded:24May2012) to Recorded 16. Vitamin E 400 UNIT Oral Capsule; 2 per day;   Therapy: (Recorded:24May2012) to Recorded  Allergies Medication  1. Codeine Derivatives 2. Opium TINC  Family History Problems  1. Family history of Chronic Renal Failure : Father 2. Denied: Family history of Family Health Status Number Of Children : Mother 3. Family history of Hypertension : Father 23. Family history of Hypertension : Mother 35. Family history of Ischemic  Stroke : Father  Social History Problems  1. Family history of Death In The Family Father   84 kidney failure 2. Family history of Death In The Family Mother   10/31/1977. Marital History - Single 4. Never A Smoker 5. Occupation:   potter 6. Denied: Tobacco Use  Review of Systems  Genitourinary: nocturia and erectile dysfunction.  Constitutional: feeling tired (fatigue).  Respiratory: shortness of breath and cough.  Musculoskeletal: back pain.    Vitals Vital Signs [Data Includes: Last 1 Day]  Recorded: 40JWJ1914 10:44AM  Blood Pressure: 126 / 85 Temperature: 97.7 F Heart Rate: 92  Physical Exam Constitutional: Well nourished and well developed . No acute distress.  Neck: The appearance of the neck is normal and no neck mass is present.  Pulmonary: No respiratory distress and normal respiratory rhythm and effort.  Cardiovascular: Heart rate and rhythm are normal . No peripheral edema.  Abdomen: The abdomen is soft and nontender. No masses are palpated. No CVA tenderness. No hernias are palpable. No hepatosplenomegaly noted.  Genitourinary: Examination of the penis demonstrates no discharge, no masses, no lesions and a normal meatus. The scrotum is without lesions. The right epididymis is palpably normal and non-tender. The left epididymis is palpably normal and non-tender. The right testis is non-tender and without masses. The left testis is non-tender and without masses.  Skin: Normal skin turgor, no visible rash and no visible skin lesions.  Neuro/Psych:. Mood and affect are appropriate.    Results/Data Urine [Data Includes: Last 1 Day]   [id number]  COLOR YELLOW   APPEARANCE CLEAR   SPECIFIC GRAVITY 1.025   pH 5.5   GLUCOSE NEG mg/dL  BILIRUBIN NEG   KETONE NEG mg/dL  BLOOD NEG   PROTEIN 30 mg/dL  UROBILINOGEN 0.2 mg/dL  NITRITE NEG   LEUKOCYTE ESTERASE NEG   SQUAMOUS EPITHELIAL/HPF RARE   WBC 0-2 WBC/hpf  RBC 0-2 RBC/hpf  BACTERIA RARE   CRYSTALS NONE SEEN    CASTS NONE SEEN    Assessment Assessed  1. Calculus of ureter (N20.1)  Discussion/Summary   Based on ongoing symptoms as well as KUB assessment today, it does appear that Darrell Williams continues to have a moderately enlarged stone in the mid ureter on the right. He is told that statistically this stone has somewhere between a 1/3 and 1/2 chance of passing. It has been about 3 weeks now however, and we have not seen much progression. There is no absolute urgent need for intervention, but he is concerned about ongoing time frame with the holidays. I do think that consideration for intervention makes sense for a couple of reasons. I do think the left-sided calcification if very suspicious for a potential distal ureteral stone and I would favor ureteroscopy versus ESWL, so we can really assess that left-sided calcification. My plan would be cystoscopy with bilateral retrograde pyelograms and if the left-sided stone is noted, given its size, then I would definitely recommend bilateral ureteroscopy with definitive management of both stones. We discussed the success rates, potential side effects, complications, recovery issues. We are going to see what we can do to potentially get him on the schedule next week before the holiday.    Amendment 30 min face to face today   Signatures  Electronically signed by : Rana Snare, M.D.; Jul 13 2014  7:55AM EST

## 2014-07-23 NOTE — Transfer of Care (Signed)
Immediate Anesthesia Transfer of Care Note  Patient: Darrell Williams  Procedure(s) Performed: Procedure(s) (LRB): CYSTOSCOPY WITH BILATERAL RETROGRADE PYELOGRAM, BILATERAL URETEROSCOPY AND STENT PLACEMENT, STONE EXTRACTION (Bilateral) HOLMIUM LASER APPLICATION (Bilateral)  Patient Location: PACU  Anesthesia Type: General  Level of Consciousness: awake, alert  and oriented  Airway & Oxygen Therapy: Patient Spontanous Breathing and Patient connected to face mask oxygen  Post-op Assessment: Report given to PACU RN and Post -op Vital signs reviewed and stable  Post vital signs: Reviewed and stable  Complications: No apparent anesthesia complications

## 2014-07-23 NOTE — Op Note (Signed)
Preoperative diagnosis: 7 mm right midureteral calculus, possible 9 mm distal left ureteral calculus Postoperative diagnosis: Bilateral ureteral calculi  Procedure: Cystoscopy, bilateral retrograde pyelography, bilateral ureteroscopy, bilateral holmium laser lithotripsy with basketing of fragments and bilateral double-J stent placement   Surgeon: Bernestine Amass M.D.  Anesthesia: Gen.  Indications: Patient developed typical right-sided renal colic and was diagnosed with a 7 mm proximal right ureteral stone with hydronephrosis. Follow-up to stone and migrated to the mid ureter. All symptoms were right sided. On CT LR the patient had what appeared to be a suspicious 8-9 mm calcification in the area of the left distal ureter. There was evidence of obstruction and therefore definitive assessment as to whether this was a ureteral calculus or not was difficult. The patient now presents for assessment of his situation and definitive treatment. Full informed consent obtained.     Technique and findings: Patient was brought to the operating room reassess successful induction of general anesthesia. He was placed in lithotomy position and prepped and draped in usual manner. Appropriate surgical timeout was performed. Cystoscopy revealed unremarkable anterior urethra. 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 The bladder was otherwise endoscopically unremarkable other than some mild trabecular change.  Right retrograde pyelogram was done with fluoroscopic interpretation. A 7 mm filling defect was noted in the right mid ureter causing moderate obstruction.   A guidewire was placed to the right renal pelvis but the stone did appear to migrate more proximally. The right distal ureter was engaged with the rigid ureteroscope but we were unable to see the stone all the way up to the ureteral pelvic junction. Fluoroscopically it appeared that the stone was now in the renal pelvis. A digital  access sheath was placed in the right ureter and a digital flexible ureteroscope was not inserted. The 7 mm stone was encountered. Holmium laser lithotriptor fiber was utilized at 0.8 J 5 Hz. The stone was broken into a proximally half a dozen pieces each of which was then basket extracted. This took considerable time since her did appear to be some narrowing at the ureteral pelvic junction and the fragments had continued to be made smaller to allow for safe extraction with the basket.   At the completion of this a double-J stent 6 French 24 cm was placed with visual as well as fluoroscopic guidance.   Attention was then turned to the left ureteral orifice. Retrograde pyelogram was done which confirmed a filling defect in the distal left ureter consistent with a nonobstructing stone of 8-9 mm size.   A guidewire was placed a left renal pelvis and the distal left ureter engaged with a 6.5 French rigid ureteroscope. A large hard stone was encountered. Considerable time was again taken in fracturing the stone into approximately 15-20 pieces with the holmium laser lithotriptor fiber at similar settings. The pieces were all basket extracted and a similar sized stent was placed on the left side. The patient was brought to recovery room as stable condition having had no obvious complications.

## 2014-07-23 NOTE — Anesthesia Procedure Notes (Addendum)
Procedure Name: Intubation Date/Time: 07/23/2014 10:12 AM Performed by: Mechele Claude Pre-anesthesia Checklist: Patient identified, Emergency Drugs available, Suction available and Patient being monitored Patient Re-evaluated:Patient Re-evaluated prior to inductionOxygen Delivery Method: Circle System Utilized Preoxygenation: Pre-oxygenation with 100% oxygen Intubation Type: IV induction Ventilation: Mask ventilation without difficulty Laryngoscope Size: 3 and Mac Grade View: Grade III Tube type: Oral Tube size: 8.0 mm Number of attempts: 3 Airway Equipment and Method: stylet,  oral airway and Video-laryngoscopy Placement Confirmation: ETT inserted through vocal cords under direct vision,  positive ETCO2 and breath sounds checked- equal and bilateral Secured at: 23 cm Tube secured with: Tape Dental Injury: Teeth and Oropharynx as per pre-operative assessment  Difficulty Due To: Difficulty was unanticipated and Difficult Airway- due to anterior larynx Future Recommendations: Recommend- induction with short-acting agent, and alternative techniques readily available Comments: Attempted RSI with cricoid using MAC 4 blade. Visualization of arytenoids. Unable to pass ETT after attempt times two. Oral airway placed. Able to mask ventilate with sevo on 8 percent. Glidescope to room, intubated with 3 glidescope by Dr, Eliseo Squires. VSS.

## 2014-07-23 NOTE — Discharge Instructions (Addendum)
[facility] [phone] Post Ureteroscopy With or Without Stent Instructions  Definitions:  Ureter: The duct that transports urine from the kidney to the bladder. Stent:   A plastic hollow tube that is placed into the ureter, from the kidney to the                 bladder to prevent the ureter from swelling shut.  GENERAL INSTRUCTIONS:  Despite the fact that no skin incisions were used, the area around the ureter and bladder is raw and irritated. The stent is a foreign body which will further irritate the bladder wall. This irritation is manifested by increased frequency of urination, both day and night, and by an increase in the urge to urinate. In some, the urge to urinate is present almost always. Sometimes the urge is strong enough that you may not be able to stop yourself from urinating. The only real cure is to remove the stent and then give time for the bladder wall to heal which can't be done until the danger of the ureter swelling shut has passed, which varies.  You may see some blood in your urine while the stent is in place and a few days afterwards. Do not be alarmed, even if the urine was clear for a while. Get off your feet and drink lots of fluids until clearing occurs. If you start to pass clots or don't improve, call us.  DIET: You may return to your normal diet immediately. Because of the raw surface of your bladder, alcohol, spicy foods, acid type foods and drinks with caffeine may cause irritation or frequency and should be used in moderation. To keep your urine flowing freely and to avoid constipation, drink plenty of fluids during the day ( 8-10 glasses ). Tip: Avoid cranberry juice because it is very acidic.  ACTIVITY: Your physical activity doesn't need to be restricted. However, if you are very active, you may see some blood in your urine. We suggest that you reduce your activity under these circumstances until the bleeding has stopped.  BOWELS: It is  important to keep your bowels regular during the postoperative period. Straining with bowel movements can cause bleeding. A bowel movement every other day is reasonable. Use a mild laxative if needed, such as Milk of Magnesia 2-3 tablespoons, or 2 Dulcolax tablets. Call if you continue to have problems. If you have been taking narcotics for pain, before, during or after your surgery, you may be constipated. Take a laxative if necessary.   MEDICATION: You should resume your pre-surgery medications unless told not to. In addition you will often be given an antibiotic to prevent infection. These should be taken as prescribed until the bottles are finished unless you are having an unusual reaction to one of the drugs.  PROBLEMS YOU SHOULD REPORT TO [place]:  Fevers over 100.5 Fahrenheit.  Heavy bleeding, or clots ( See above notes about blood in urine ).  Inability to urinate.  Drug reactions ( hives, rash, nausea, vomiting, diarrhea ).  Severe burning or pain with urination that is not improving.  FOLLOW-UP: You will need a follow-up appointment to monitor your progress. Call for this appointment at the number listed above. Usually the first appointment will be about three to fourteen days after your surgery.    We will cancel your appointment for December 3 and changing it to the following week  Post Anesthesia Home Care Instructions  Activity: Get plenty of rest for the remainder of the day.  A responsible adult should stay with you for 24 hours following the procedure.  For the next 24 hours, DO NOT: -Drive a car -Paediatric nurse -Drink alcoholic beverages -Take any medication unless instructed by your physician -Make any legal decisions or sign important papers.  Meals: Start with liquid foods such as gelatin or soup. Progress to regular foods as tolerated. Avoid greasy, spicy, heavy foods. If nausea and/or vomiting occur, drink only clear liquids until the nausea and/or vomiting  subsides. Call your physician if vomiting continues.  Special Instructions/Symptoms: Your throat may feel dry or sore from the anesthesia or the breathing tube placed in your throat during surgery. If this causes discomfort, gargle with warm salt water. The discomfort should disappear within 24 hours.

## 2014-07-24 ENCOUNTER — Encounter (HOSPITAL_BASED_OUTPATIENT_CLINIC_OR_DEPARTMENT_OTHER): Payer: Self-pay | Admitting: Urology

## 2014-07-24 NOTE — Anesthesia Postprocedure Evaluation (Signed)
  Anesthesia Post-op Note  Patient: Darrell Williams  Procedure(s) Performed: Procedure(s) (LRB): CYSTOSCOPY WITH BILATERAL RETROGRADE PYELOGRAM, BILATERAL URETEROSCOPY AND STENT PLACEMENT, STONE EXTRACTION (Bilateral) HOLMIUM LASER APPLICATION (Bilateral)  Patient Location: PACU  Anesthesia Type: General  Level of Consciousness: awake and alert   Airway and Oxygen Therapy: Patient Spontanous Breathing  Post-op Pain: mild  Post-op Assessment: Post-op Vital signs reviewed, Patient's Cardiovascular Status Stable, Respiratory Function Stable, Patent Airway and No signs of Nausea or vomiting  Last Vitals:  Filed Vitals:   07/23/14 1405  BP: 149/87  Pulse: 50  Temp: 36.4 C  Resp: 18    Post-op Vital Signs: stable   Complications: No apparent anesthesia complications

## 2014-07-31 DIAGNOSIS — R972 Elevated prostate specific antigen [PSA]: Secondary | ICD-10-CM | POA: Diagnosis not present

## 2014-07-31 DIAGNOSIS — N201 Calculus of ureter: Secondary | ICD-10-CM | POA: Diagnosis not present

## 2014-07-31 DIAGNOSIS — N200 Calculus of kidney: Secondary | ICD-10-CM | POA: Diagnosis not present

## 2014-08-06 DIAGNOSIS — Z23 Encounter for immunization: Secondary | ICD-10-CM | POA: Diagnosis not present

## 2014-08-11 ENCOUNTER — Other Ambulatory Visit (HOSPITAL_COMMUNITY): Payer: Self-pay | Admitting: Cardiology

## 2014-08-11 DIAGNOSIS — E785 Hyperlipidemia, unspecified: Secondary | ICD-10-CM

## 2014-08-13 ENCOUNTER — Encounter (HOSPITAL_COMMUNITY): Payer: Medicare Other

## 2014-08-13 ENCOUNTER — Other Ambulatory Visit: Payer: Self-pay | Admitting: *Deleted

## 2014-08-14 ENCOUNTER — Other Ambulatory Visit: Payer: Self-pay

## 2014-08-14 DIAGNOSIS — E785 Hyperlipidemia, unspecified: Secondary | ICD-10-CM

## 2014-08-14 MED ORDER — ATORVASTATIN CALCIUM 80 MG PO TABS: 80.0000 mg | ORAL_TABLET | Freq: Every morning | ORAL | Status: DC

## 2014-08-21 ENCOUNTER — Other Ambulatory Visit (HOSPITAL_COMMUNITY): Payer: Self-pay | Admitting: Internal Medicine

## 2014-09-07 ENCOUNTER — Ambulatory Visit (HOSPITAL_COMMUNITY): Payer: Medicare Other | Attending: Internal Medicine | Admitting: *Deleted

## 2014-09-07 DIAGNOSIS — I6523 Occlusion and stenosis of bilateral carotid arteries: Secondary | ICD-10-CM | POA: Insufficient documentation

## 2014-09-07 DIAGNOSIS — H40023 Open angle with borderline findings, high risk, bilateral: Secondary | ICD-10-CM | POA: Diagnosis not present

## 2014-09-07 NOTE — Progress Notes (Signed)
Carotid Duplex Performed 

## 2014-10-19 DIAGNOSIS — H52223 Regular astigmatism, bilateral: Secondary | ICD-10-CM | POA: Diagnosis not present

## 2014-10-19 DIAGNOSIS — H5203 Hypermetropia, bilateral: Secondary | ICD-10-CM | POA: Diagnosis not present

## 2014-10-19 DIAGNOSIS — H4011X2 Primary open-angle glaucoma, moderate stage: Secondary | ICD-10-CM | POA: Diagnosis not present

## 2014-10-25 ENCOUNTER — Other Ambulatory Visit (HOSPITAL_COMMUNITY): Payer: Self-pay | Admitting: *Deleted

## 2014-10-29 ENCOUNTER — Other Ambulatory Visit (HOSPITAL_COMMUNITY): Payer: Self-pay | Admitting: *Deleted

## 2014-11-01 ENCOUNTER — Other Ambulatory Visit (HOSPITAL_COMMUNITY): Payer: Self-pay | Admitting: Cardiology

## 2014-11-01 MED ORDER — POTASSIUM CHLORIDE CRYS ER 10 MEQ PO TBCR: 10.0000 meq | EXTENDED_RELEASE_TABLET | Freq: Two times a day (BID) | ORAL | Status: DC

## 2014-11-02 ENCOUNTER — Other Ambulatory Visit (HOSPITAL_COMMUNITY): Payer: Self-pay | Admitting: *Deleted

## 2014-11-02 MED ORDER — POTASSIUM CHLORIDE CRYS ER 10 MEQ PO TBCR: 30.0000 meq | EXTENDED_RELEASE_TABLET | Freq: Every day | ORAL | Status: DC

## 2014-11-05 ENCOUNTER — Other Ambulatory Visit (HOSPITAL_COMMUNITY): Payer: Self-pay

## 2014-11-05 MED ORDER — POTASSIUM CHLORIDE CRYS ER 20 MEQ PO TBCR: 20.0000 meq | EXTENDED_RELEASE_TABLET | Freq: Three times a day (TID) | ORAL | Status: DC

## 2014-12-13 DIAGNOSIS — N201 Calculus of ureter: Secondary | ICD-10-CM | POA: Diagnosis not present

## 2014-12-13 DIAGNOSIS — R972 Elevated prostate specific antigen [PSA]: Secondary | ICD-10-CM | POA: Diagnosis not present

## 2014-12-20 DIAGNOSIS — N401 Enlarged prostate with lower urinary tract symptoms: Secondary | ICD-10-CM | POA: Diagnosis not present

## 2014-12-20 DIAGNOSIS — R972 Elevated prostate specific antigen [PSA]: Secondary | ICD-10-CM | POA: Diagnosis not present

## 2014-12-20 DIAGNOSIS — N200 Calculus of kidney: Secondary | ICD-10-CM | POA: Diagnosis not present

## 2014-12-20 DIAGNOSIS — N138 Other obstructive and reflux uropathy: Secondary | ICD-10-CM | POA: Diagnosis not present

## 2015-02-15 DIAGNOSIS — H4011X1 Primary open-angle glaucoma, mild stage: Secondary | ICD-10-CM | POA: Diagnosis not present

## 2015-04-08 ENCOUNTER — Other Ambulatory Visit (HOSPITAL_COMMUNITY): Payer: Self-pay | Admitting: Internal Medicine

## 2015-04-08 DIAGNOSIS — I6523 Occlusion and stenosis of bilateral carotid arteries: Secondary | ICD-10-CM

## 2015-04-11 ENCOUNTER — Ambulatory Visit (HOSPITAL_COMMUNITY)
Admission: RE | Admit: 2015-04-11 | Discharge: 2015-04-11 | Disposition: A | Discharge status: Home / Self Care | Payer: Medicare Other | Source: Ambulatory Visit | Attending: Cardiology | Admitting: Cardiology

## 2015-04-11 DIAGNOSIS — I6523 Occlusion and stenosis of bilateral carotid arteries: Secondary | ICD-10-CM | POA: Insufficient documentation

## 2015-04-18 ENCOUNTER — Ambulatory Visit (HOSPITAL_COMMUNITY)
Admission: RE | Admit: 2015-04-18 | Discharge: 2015-04-18 | Disposition: A | Discharge status: Home / Self Care | Payer: Medicare Other | Source: Ambulatory Visit | Attending: Cardiology | Admitting: Cardiology

## 2015-04-18 VITALS — BP 110/74 | HR 89 | Wt 196.4 lb

## 2015-04-18 DIAGNOSIS — Z79899 Other long term (current) drug therapy: Secondary | ICD-10-CM | POA: Diagnosis not present

## 2015-04-18 DIAGNOSIS — I10 Essential (primary) hypertension: Secondary | ICD-10-CM | POA: Insufficient documentation

## 2015-04-18 DIAGNOSIS — E785 Hyperlipidemia, unspecified: Secondary | ICD-10-CM | POA: Diagnosis not present

## 2015-04-18 DIAGNOSIS — I2510 Atherosclerotic heart disease of native coronary artery without angina pectoris: Secondary | ICD-10-CM

## 2015-04-18 DIAGNOSIS — I6523 Occlusion and stenosis of bilateral carotid arteries: Secondary | ICD-10-CM | POA: Insufficient documentation

## 2015-04-18 DIAGNOSIS — Z7982 Long term (current) use of aspirin: Secondary | ICD-10-CM | POA: Diagnosis not present

## 2015-04-18 DIAGNOSIS — K219 Gastro-esophageal reflux disease without esophagitis: Secondary | ICD-10-CM | POA: Insufficient documentation

## 2015-04-18 DIAGNOSIS — R072 Precordial pain: Secondary | ICD-10-CM

## 2015-04-18 LAB — LIPID PANEL
CHOLESTEROL: 186 mg/dL (ref 0–200)
HDL: 64 mg/dL (ref >40)
LDL Cholesterol: 109 mg/dL — ABNORMAL HIGH (ref 0–99)
Total CHOL/HDL Ratio: 2.9 RATIO
Triglycerides: 67 mg/dL (ref <150)
VLDL: 13 mg/dL (ref 0–40)

## 2015-04-18 LAB — COMPREHENSIVE METABOLIC PANEL
ALBUMIN: 4.3 g/dL (ref 3.5–5.0)
ALK PHOS: 77 U/L (ref 38–126)
ALT: 20 U/L (ref 17–63)
AST: 24 U/L (ref 15–41)
Anion gap: 11 (ref 5–15)
BILIRUBIN TOTAL: 1.2 mg/dL (ref 0.3–1.2)
BUN: 19 mg/dL (ref 6–20)
CALCIUM: 9.4 mg/dL (ref 8.9–10.3)
CO2: 22 mmol/L (ref 22–32)
Chloride: 106 mmol/L (ref 101–111)
Creatinine, Ser: 1.46 mg/dL — ABNORMAL HIGH (ref 0.61–1.24)
GFR calc Af Amer: 54 mL/min — ABNORMAL LOW (ref >60)
GFR, EST NON AFRICAN AMERICAN: 47 mL/min — AB (ref >60)
GLUCOSE: 101 mg/dL — AB (ref 65–99)
POTASSIUM: 4.3 mmol/L (ref 3.5–5.1)
Sodium: 139 mmol/L (ref 135–145)
TOTAL PROTEIN: 7.5 g/dL (ref 6.5–8.1)

## 2015-04-18 LAB — T4, FREE: Free T4: 0.74 ng/dL (ref 0.61–1.12)

## 2015-04-18 LAB — TSH: TSH: 2.787 u[IU]/mL (ref 0.350–4.500)

## 2015-04-18 NOTE — Progress Notes (Signed)
Patient ID: Darrell Williams, male   DOB: 1945-10-15, 69 y.o.   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 Coralie Williams, returns for routine followup.He has a history for hypertension, hyperlipidemia, carotid stenosis, GERD and allergies.  He has mild CAD by cardiac CT in 10/11. Stress test in December, 2006 and again in November 2009 which were normal.   Had cardiac CT in 10/11 as part of PROMISE trial:   1.  Calcium score of 120 Agatston units.  This places the patient in the 55th percentile for his age and gender.  This suggests intermediate risk for future cardiac events. 2.  Mild proximal LAD stenosis (estimate around 25%).  Otherwise, no significant disease.  Labs 03/2013: Cholesterol 172, HDL 65, LDL 82, Trig 123; K+ 3.8, BUN 25, Creatinine 1.5, Total bilirubin 1.1, AST 32, ALT 33 Labs 03/2013: Cholesterol 187, HDL 59, LDL 104, Trig 119;    Follow up:  Doing well. No CP or SOB. 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 Normally BP 120-125/80-85 but goes up when he eats salt. No TIA symptoms.   PMH 1) HTN 2) HLD 3) Allergies 4) Carotid stenosis - 02/2012: Stable 60-79% bilateral ICA stenosis - 08/2012: Stable 40-59% RICA stenosis, 37-85% LICA stenosis - 03/8501: Stable 77-41% RICA, 28-78% LICA stenosis - 01/7671: RICA 09-47% LICA 09-62%  Lab Results  Component Value Date   CHOL 187 03/20/2014   HDL 59.40 03/20/2014   LDLCALC 104* 03/20/2014   TRIG 119.0 03/20/2014   CHOLHDL 3 03/20/2014   ROS: All systems negative except as listed in HPI, PMH and Problem List.  Past Medical History  Diagnosis Date  . Hypertension   . Hyperlipidemia   . Plaque     cholesterol plaque--on his retina exam  . Bilateral ureteral calculi   . Bilateral carotid artery stenosis     Bilateral ICA  60-79%  per last duplex 02-21-2014  . Coronary atherosclerosis of  native coronary artery     cardiologist-  dr Darrell Williams--  pLAD 25%  per CT scan  . Urgency of urination   . Frequency of urination   . BPH (benign prostatic hypertrophy)   . Wears glasses   . Glaucoma of both eyes   . At risk for sleep apnea     STOP-BANG= 4    SENT TO PCP 07-16-2014    Current Outpatient Prescriptions  Medication Sig Dispense Refill  . amLODipine (NORVASC) 10 MG tablet Take one-half tablet by  mouth two times daily 90 tablet 3  . atorvastatin (LIPITOR) 80 MG tablet Take 1 tablet (80 mg total) by mouth every morning. 90 tablet 3  . Ca Carbonate-Mag Hydroxide (ROLAIDS PO) Take by mouth as needed.    . doxazosin (CARDURA) 4 MG tablet Take 2 mg by mouth every morning.    . fexofenadine (ALLEGRA) 180 MG tablet Take 180 mg by mouth daily.    . hydrochlorothiazide (HYDRODIURIL) 25 MG tablet Take 25 mg by mouth as needed (FOR ANKLE SWELLING).     Marland Kitchen loratadine (CLARITIN) 10 MG tablet Take 10 mg by mouth daily as needed for allergies.    . Meth-Hyo-M Bl-Na Phos-Ph Sal (URIBEL) 118 MG CAPS Take 1 capsule (118 mg total) by mouth 3 (three) times daily as needed. 15 capsule 1  . oxyCODONE-acetaminophen (PERCOCET/ROXICET) 5-325 MG per tablet Take 1 tablet by mouth  every 4 (four) hours as needed for severe pain.    Marland Kitchen oxyCODONE-acetaminophen (ROXICET) 5-325 MG per tablet Take 1-2 tablets by mouth every 6 (six) hours as needed. 30 tablet 0  . potassium chloride (K-DUR,KLOR-CON) 20 MEQ tablet Take 1 tablet (20 mEq total) by mouth 3 (three) times daily. 90 tablet 3  . timolol (TIMOPTIC) 0.5 % ophthalmic solution Place 1 drop into both eyes 2 (two) times daily.     No current facility-administered medications for this encounter.   Filed Vitals:   04/18/15 1436  BP: 110/74  Pulse: 89  Weight: 196 lb 6.4 oz (89.086 kg)  SpO2: 95%   PHYSICAL EXAM: General:  Well appearing. No resp difficulty HEENT: normal Neck: supple. JVP flat. Carotids 2+ bilaterally; no bruits. No lymphadenopathy  or thryomegaly appreciated. Cor: PMI normal. Regular rate & rhythm. No rubs or murmurs. +s4 Lungs: clear but decreased throughout Abdomen: soft, nontender, nondistended. No hepatosplenomegaly. No bruits or masses. Good bowel sounds. Extremities: no cyanosis, clubbing, rash, edema Neuro: alert & orientedx3, cranial nerves grossly intact. Moves all 4 extremities w/o difficulty. Affect pleasant.  ASSESSMENT & PLAN:  1) HTN - Controlled on norvasc, doxazosin, and HCTZ - Encouraged patient to watch his diet and get more exercise  2) HLD - Goal LDL < 70 - Repeat lipid profile today  3) Bilateral carotid stenosis - Stable, will repeat in 6 months.   4) CAD - Mild. Asymptomatic. Continue ASA and statin.   Glori Bickers MD  2:56 PM

## 2015-04-18 NOTE — Addendum Note (Signed)
Encounter addended by: Scarlette Calico, RN on: 04/18/2015  3:36 PM<BR>     Documentation filed: Medications

## 2015-04-18 NOTE — Patient Instructions (Signed)
Labs today  We will contact you in 6 months to schedule your next appointment and carotid ultrasound

## 2015-04-18 NOTE — Addendum Note (Signed)
Encounter addended by: Scarlette Calico, RN on: 04/18/2015  3:18 PM<BR>     Documentation filed: Visit Diagnoses, Dx Association, Patient Instructions Section, Orders

## 2015-04-23 ENCOUNTER — Encounter (HOSPITAL_COMMUNITY): Payer: Self-pay | Admitting: *Deleted

## 2015-04-26 ENCOUNTER — Telehealth (HOSPITAL_COMMUNITY): Payer: Self-pay | Admitting: Cardiology

## 2015-04-26 ENCOUNTER — Encounter (HOSPITAL_COMMUNITY): Payer: Self-pay | Admitting: Cardiology

## 2015-04-26 MED ORDER — ROSUVASTATIN CALCIUM 40 MG PO TABS: 40.0000 mg | ORAL_TABLET | Freq: Every day | ORAL | Status: DC

## 2015-04-26 NOTE — Telephone Encounter (Signed)
Patient called to review lab results Lipid results reviewed with patient Patient voiced understanding and lipid letter mailed to patient

## 2015-06-11 ENCOUNTER — Telehealth (HOSPITAL_COMMUNITY): Payer: Self-pay

## 2015-06-11 NOTE — Telephone Encounter (Signed)
Crestor is the most potent statin available and in patients who have evidence of coronary artery disease, we tend to see better outcomes if their LDL is at least <100, if not <70.   Ruta Hinds. Velva Harman, PharmD, BCPS, CPP Clinical Pharmacist Pager: [phone] Phone: [phone] 06/11/2015 9:46 AM

## 2015-06-11 NOTE — Telephone Encounter (Signed)
Patient concerned about going from Lipitor to Crestor will cause him to be in the donut hole next year with his medications.  Would like to know if the Crestor is going to be that much more of a benefit over Lipitor.

## 2015-06-17 ENCOUNTER — Telehealth (HOSPITAL_COMMUNITY): Payer: Self-pay

## 2015-06-17 DIAGNOSIS — I5020 Unspecified systolic (congestive) heart failure: Secondary | ICD-10-CM

## 2015-06-17 MED ORDER — DOXAZOSIN MESYLATE 4 MG PO TABS: 2.0000 mg | ORAL_TABLET | Freq: Every morning | ORAL | Status: DC

## 2015-06-17 MED ORDER — AMLODIPINE BESYLATE 10 MG PO TABS: 5.0000 mg | ORAL_TABLET | Freq: Two times a day (BID) | ORAL | Status: DC

## 2015-06-17 NOTE — Telephone Encounter (Signed)
Lab req sent to patient

## 2015-06-17 NOTE — Telephone Encounter (Signed)
Patient has decided to go with generic Crestor.  Cost will be lower than was given the first time he called at $90 with $129 added to all over cost of RX so it should keep in out of the donut hole Also reordered Cardura and Amlodipine as he requested Lipid profile ordered for 12/7.  Will have drawn at [facility] location

## 2015-06-18 NOTE — Telephone Encounter (Signed)
Patient is going to start generic Crestor,.  Has RX at mail pharmacy.  Will have his Lipid panel done the first week of December

## 2015-07-04 DIAGNOSIS — Z23 Encounter for immunization: Secondary | ICD-10-CM | POA: Diagnosis not present

## 2015-07-23 DIAGNOSIS — L723 Sebaceous cyst: Secondary | ICD-10-CM | POA: Diagnosis not present

## 2015-07-26 ENCOUNTER — Other Ambulatory Visit: Payer: Self-pay | Admitting: Internal Medicine

## 2015-07-26 ENCOUNTER — Other Ambulatory Visit (HOSPITAL_COMMUNITY): Payer: Self-pay | Admitting: Infectious Diseases

## 2015-07-26 DIAGNOSIS — I5020 Unspecified systolic (congestive) heart failure: Secondary | ICD-10-CM | POA: Diagnosis not present

## 2015-07-26 LAB — LIPID PANEL
CHOLESTEROL: 175 mg/dL (ref 125–200)
HDL: 62 mg/dL (ref >=40)
LDL Cholesterol: 89 mg/dL (ref <130)
TRIGLYCERIDES: 122 mg/dL (ref <150)
Total CHOL/HDL Ratio: 2.8 Ratio (ref <=5.0)
VLDL: 24 mg/dL (ref <30)

## 2015-08-08 ENCOUNTER — Telehealth (HOSPITAL_COMMUNITY): Payer: Self-pay | Admitting: *Deleted

## 2015-08-08 ENCOUNTER — Encounter (HOSPITAL_COMMUNITY): Payer: Self-pay | Admitting: *Deleted

## 2015-08-08 NOTE — Telephone Encounter (Signed)
Pt left a VM for his lab results, attempted to call pt back and left a mess on his ID VM that lipids looked good and were improved from Aug, letter sent with results as well can call back for further questions

## 2015-08-21 DIAGNOSIS — H401121 Primary open-angle glaucoma, left eye, mild stage: Secondary | ICD-10-CM | POA: Diagnosis not present

## 2015-10-11 ENCOUNTER — Other Ambulatory Visit: Payer: Self-pay | Admitting: Internal Medicine

## 2015-11-06 ENCOUNTER — Telehealth (HOSPITAL_COMMUNITY): Payer: Self-pay | Admitting: *Deleted

## 2015-11-06 DIAGNOSIS — I6523 Occlusion and stenosis of bilateral carotid arteries: Secondary | ICD-10-CM

## 2015-11-06 NOTE — Telephone Encounter (Signed)
Pt due for repeat carotid u/s and f/u with Dr Haroldine Laws, order placed will contact pt to schedule

## 2015-11-13 ENCOUNTER — Ambulatory Visit (HOSPITAL_COMMUNITY)
Admission: RE | Admit: 2015-11-13 | Discharge: 2015-11-13 | Disposition: A | Discharge status: Home / Self Care | Payer: Medicare Other | Source: Ambulatory Visit | Attending: Cardiovascular Disease | Admitting: Cardiovascular Disease

## 2015-11-13 DIAGNOSIS — I6523 Occlusion and stenosis of bilateral carotid arteries: Secondary | ICD-10-CM

## 2015-11-13 DIAGNOSIS — I2510 Atherosclerotic heart disease of native coronary artery without angina pectoris: Secondary | ICD-10-CM | POA: Diagnosis not present

## 2015-11-13 DIAGNOSIS — I10 Essential (primary) hypertension: Secondary | ICD-10-CM | POA: Diagnosis not present

## 2015-11-13 DIAGNOSIS — E785 Hyperlipidemia, unspecified: Secondary | ICD-10-CM | POA: Diagnosis not present

## 2015-11-19 DIAGNOSIS — H5203 Hypermetropia, bilateral: Secondary | ICD-10-CM | POA: Diagnosis not present

## 2015-11-19 DIAGNOSIS — H401191 Primary open-angle glaucoma, unspecified eye, mild stage: Secondary | ICD-10-CM | POA: Diagnosis not present

## 2015-11-19 DIAGNOSIS — H5340 Unspecified visual field defects: Secondary | ICD-10-CM | POA: Diagnosis not present

## 2015-11-19 DIAGNOSIS — H52223 Regular astigmatism, bilateral: Secondary | ICD-10-CM | POA: Diagnosis not present

## 2015-11-19 DIAGNOSIS — H401121 Primary open-angle glaucoma, left eye, mild stage: Secondary | ICD-10-CM | POA: Diagnosis not present

## 2015-11-19 DIAGNOSIS — H401111 Primary open-angle glaucoma, right eye, mild stage: Secondary | ICD-10-CM | POA: Diagnosis not present

## 2016-04-30 ENCOUNTER — Ambulatory Visit (HOSPITAL_COMMUNITY)
Admission: RE | Admit: 2016-04-30 | Discharge: 2016-04-30 | Disposition: A | Discharge status: Home / Self Care | Payer: Medicare Other | Source: Ambulatory Visit | Attending: Internal Medicine | Admitting: Internal Medicine

## 2016-04-30 VITALS — BP 132/78 | HR 76 | Wt 205.0 lb

## 2016-04-30 DIAGNOSIS — I2510 Atherosclerotic heart disease of native coronary artery without angina pectoris: Secondary | ICD-10-CM | POA: Diagnosis not present

## 2016-04-30 DIAGNOSIS — I6523 Occlusion and stenosis of bilateral carotid arteries: Secondary | ICD-10-CM

## 2016-04-30 DIAGNOSIS — H409 Unspecified glaucoma: Secondary | ICD-10-CM | POA: Diagnosis not present

## 2016-04-30 DIAGNOSIS — E785 Hyperlipidemia, unspecified: Secondary | ICD-10-CM

## 2016-04-30 DIAGNOSIS — I110 Hypertensive heart disease with heart failure: Secondary | ICD-10-CM | POA: Diagnosis not present

## 2016-04-30 DIAGNOSIS — R05 Cough: Secondary | ICD-10-CM

## 2016-04-30 DIAGNOSIS — K219 Gastro-esophageal reflux disease without esophagitis: Secondary | ICD-10-CM | POA: Insufficient documentation

## 2016-04-30 DIAGNOSIS — N401 Enlarged prostate with lower urinary tract symptoms: Secondary | ICD-10-CM | POA: Insufficient documentation

## 2016-04-30 DIAGNOSIS — I10 Essential (primary) hypertension: Secondary | ICD-10-CM

## 2016-04-30 DIAGNOSIS — Z87442 Personal history of urinary calculi: Secondary | ICD-10-CM | POA: Diagnosis not present

## 2016-04-30 DIAGNOSIS — R350 Frequency of micturition: Secondary | ICD-10-CM | POA: Insufficient documentation

## 2016-04-30 DIAGNOSIS — R059 Cough, unspecified: Secondary | ICD-10-CM

## 2016-04-30 LAB — COMPREHENSIVE METABOLIC PANEL
ALBUMIN: 4.2 g/dL (ref 3.5–5.0)
ALK PHOS: 68 U/L (ref 38–126)
ALT: 23 U/L (ref 17–63)
ANION GAP: 6 (ref 5–15)
AST: 22 U/L (ref 15–41)
BUN: 19 mg/dL (ref 6–20)
CO2: 27 mmol/L (ref 22–32)
Calcium: 9.7 mg/dL (ref 8.9–10.3)
Chloride: 109 mmol/L (ref 101–111)
Creatinine, Ser: 1.51 mg/dL — ABNORMAL HIGH (ref 0.61–1.24)
GFR calc Af Amer: 51 mL/min — ABNORMAL LOW (ref >60)
GFR calc non Af Amer: 44 mL/min — ABNORMAL LOW (ref >60)
Glucose, Bld: 112 mg/dL — ABNORMAL HIGH (ref 65–99)
Potassium: 4.9 mmol/L (ref 3.5–5.1)
SODIUM: 142 mmol/L (ref 135–145)
Total Bilirubin: 1 mg/dL (ref 0.3–1.2)
Total Protein: 7.2 g/dL (ref 6.5–8.1)

## 2016-04-30 LAB — LIPID PANEL
CHOL/HDL RATIO: 2.8 ratio
Cholesterol: 177 mg/dL (ref 0–200)
HDL: 63 mg/dL (ref >40)
LDL Cholesterol: 96 mg/dL (ref 0–99)
Triglycerides: 89 mg/dL (ref <150)
VLDL: 18 mg/dL (ref 0–40)

## 2016-04-30 LAB — CBC
HCT: 53.5 % — ABNORMAL HIGH (ref 39.0–52.0)
Hemoglobin: 17.6 g/dL — ABNORMAL HIGH (ref 13.0–17.0)
MCH: 30.6 pg (ref 26.0–34.0)
MCHC: 32.9 g/dL (ref 30.0–36.0)
MCV: 92.9 fL (ref 78.0–100.0)
Platelets: 188 10*3/uL (ref 150–400)
RBC: 5.76 MIL/uL (ref 4.22–5.81)
RDW: 14.4 % (ref 11.5–15.5)
WBC: 4 10*3/uL (ref 4.0–10.5)

## 2016-04-30 LAB — T4, FREE: FREE T4: 0.72 ng/dL (ref 0.61–1.12)

## 2016-04-30 LAB — TSH: TSH: 3.1 u[IU]/mL (ref 0.350–4.500)

## 2016-04-30 MED ORDER — DOXAZOSIN MESYLATE 4 MG PO TABS: 4.0000 mg | ORAL_TABLET | Freq: Every morning | ORAL | 3 refills | Status: DC

## 2016-04-30 NOTE — Patient Instructions (Signed)
Increase Doxazosin to 4 mg daily  Labs today  Chest x-ray today  Your physician has recommended that you have a pulmonary function test. Pulmonary Function Tests are a group of tests that measure how well air moves in and out of your lungs.  We will contact you in 1 year to schedule your next appointment.

## 2016-04-30 NOTE — Addendum Note (Signed)
Encounter addended by: Scarlette Calico, RN on: 04/30/2016  1:08 PM<BR>    Actions taken: Visit diagnoses modified, Diagnosis association updated, Order Entry activity accessed, Sign clinical note

## 2016-04-30 NOTE — Progress Notes (Signed)
Advanced Heart Failure Medication Review by a Pharmacist  Does the patient  feel that his/her medications are working for him/her?  no  Has the patient been experiencing any side effects to the medications prescribed?  no  Does the patient measure his/her own blood pressure or blood glucose at home?  no   Does the patient have any problems obtaining medications due to transportation or finances?   no  Understanding of regimen: good Understanding of indications: good Potential of compliance: good Patient understands to avoid NSAIDs. Patient understands to avoid decongestants.  Issues to address at subsequent visits: None   Pharmacist comments:  Darrell Williams is a pleasant 70 yo M presenting with a current medication list. He reports good compliance with his regimen and has not had take hctz for ankle swelling recently. He did not have any other medication-related questions or concerns for me at this time.   Ruta Hinds. Velva Harman, PharmD, BCPS, CPP Clinical Pharmacist Pager: [phone] Phone: [phone] 04/30/2016 12:25 PM      Time with patient: 10 minutes Preparation and documentation time: 2 minutes Total time: 12 minutes

## 2016-04-30 NOTE — Progress Notes (Signed)
CARDIOLOGY CLINIC NOTE  Patient ID: Darrell Williams, male   DOB: 23-Sep-1945, 70 y.o.   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 Coralie Keens, returns for routine followup.He has a history for hypertension, hyperlipidemia, carotid stenosis, GERD and allergies.  He has mild CAD by cardiac CT in 10/11. Stress test in December, 2006 and again in November 2009 which were normal.   Had cardiac CT in 10/11 as part of PROMISE trial:   1.  Calcium score of 120 Agatston units.  This places the patient in the 55th percentile for his age and gender.  This suggests intermediate risk for future cardiac events. 2.  Mild proximal LAD stenosis (estimate around 25%).  Otherwise, no significant disease.  Carotid u/s (3/17) Stable, 40-59% RICA stenosis. Stable, A999333 LICA stenosis, with mildly elevated velocities compared to previous exam  Labs 03/2013: Cholesterol 172, HDL 65, LDL 82, Trig 123; K+ 3.8, BUN 25, Creatinine 1.5, Total bilirubin 1.1, AST 32, ALT 33 Labs 03/2013: Cholesterol 187, HDL 59, LDL 104, Trig 119;    Follow up:  Doing well. Main complaint is a chronic cough. Seems to be getting worse. Non-productive. No CP or SOB. 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 BP this morning 145/100 usually runs 135/80.   PMH 1) HTN 2) HLD 3) Allergies 4) Carotid stenosis - 02/2012: Stable 60-79% bilateral ICA stenosis - 08/2012: Stable 40-59% RICA stenosis, A999333 LICA stenosis - 123456: Stable A999333 RICA, A999333 LICA stenosis - 123XX123: RICA 123456 LICA A999333   123XX123; RICA 123456 LICA A999333 LICA stenosis, with mildly elevated velocities compared to previous exam  Lab Results  Component Value Date   CHOL 175 07/26/2015   HDL 62 07/26/2015   LDLCALC 89 07/26/2015   TRIG 122 07/26/2015   CHOLHDL 2.8 07/26/2015   ROS: All systems negative except as  listed in HPI, PMH and Problem List.  Past Medical History:  Diagnosis Date  . At risk for sleep apnea    STOP-BANG= 4    SENT TO PCP 07-16-2014  . Bilateral carotid artery stenosis    Bilateral ICA  60-79%  per last duplex 02-21-2014  . Bilateral ureteral calculi   . BPH (benign prostatic hypertrophy)   . Coronary atherosclerosis of native coronary artery    cardiologist-  dr bensinhom--  pLAD 25%  per CT scan  . Frequency of urination   . Glaucoma of both eyes   . Hyperlipidemia   . Hypertension   . Plaque    cholesterol plaque--on his retina exam  . Urgency of urination   . Wears glasses     Current Outpatient Prescriptions  Medication Sig Dispense Refill  . amLODipine (NORVASC) 10 MG tablet Take 0.5 tablets (5 mg total) by mouth 2 (two) times daily. 90 tablet 3  . Ca Carbonate-Mag Hydroxide (ROLAIDS PO) Take by mouth as needed.    . doxazosin (CARDURA) 4 MG tablet Take 0.5 tablets (2 mg total) by mouth every morning. 45 tablet 3  . fexofenadine (ALLEGRA) 180 MG tablet Take 180 mg by mouth daily.    Marland Kitchen KLOR-CON M20 20 MEQ tablet Take 1 tablet by mouth 3  times a day 270 tablet 3  . loratadine (CLARITIN) 10 MG tablet Take 10 mg by mouth daily as needed for allergies.    . potassium chloride SA (K-DUR,KLOR-CON) 20 MEQ tablet  Take 20-40 mEq by mouth 2 (two) times daily. Take 40 meq (2 tablets) in the morning and 20 meq (1 tablet) in the afternoon    . rosuvastatin (CRESTOR) 40 MG tablet Take 1 tablet (40 mg total) by mouth daily. 90 tablet 3  . timolol (TIMOPTIC) 0.5 % ophthalmic solution Place 1 drop into both eyes 2 (two) times daily.    . hydrochlorothiazide (HYDRODIURIL) 25 MG tablet Take 25 mg by mouth as needed (FOR ANKLE SWELLING).      No current facility-administered medications for this encounter.    Vitals:   04/30/16 1222  BP: 132/78  BP Location: Left Arm  Patient Position: Sitting  Cuff Size: Normal  Pulse: 76  SpO2: 95%  Weight: 205 lb (93 kg)   PHYSICAL  EXAM: General:  Well appearing. No resp difficulty HEENT: normal Neck: supple. JVP flat. Carotids 2+ bilaterally; no bruits. No lymphadenopathy or thryomegaly appreciated. Cor: PMI normal. Regular rate & rhythm. No rubs or murmurs. +s4 Lungs: clear but decreased throughout Abdomen: soft, nontender, nondistended. No hepatosplenomegaly. No bruits or masses. Good bowel sounds. Extremities: no cyanosis, clubbing, rash, edema Neuro: alert & orientedx3, cranial nerves grossly intact. Moves all 4 extremities w/o difficulty. Affect pleasant.  ASSESSMENT & PLAN:  1) HTN - Mildly elevated. Increase doxazosin to 4mg  daily.  - Encouraged patient to watch his diet and get more exercise  2) HLD - Goal LDL < 70 - Repeat lipid profile today  3) Bilateral carotid stenosis - Stable, will repeat yearly  4) CAD - Mild. Asymptomatic. Continue ASA and statin.   5) Cough - Check CXR and PFTs  Andray Assefa MD  12:40 PM

## 2016-05-01 LAB — HCV RNA QUANT: HCV Quantitative: NOT DETECTED IU/mL (ref >50)

## 2016-05-06 DIAGNOSIS — B079 Viral wart, unspecified: Secondary | ICD-10-CM

## 2016-05-06 DIAGNOSIS — J309 Allergic rhinitis, unspecified: Secondary | ICD-10-CM | POA: Insufficient documentation

## 2016-05-06 DIAGNOSIS — N183 Chronic kidney disease, stage 3 unspecified: Secondary | ICD-10-CM | POA: Insufficient documentation

## 2016-05-06 DIAGNOSIS — H409 Unspecified glaucoma: Secondary | ICD-10-CM

## 2016-05-06 HISTORY — DX: Allergic rhinitis, unspecified: J30.9

## 2016-05-06 HISTORY — DX: Unspecified glaucoma: H40.9

## 2016-05-06 HISTORY — DX: Viral wart, unspecified: B07.9

## 2016-05-12 ENCOUNTER — Encounter (HOSPITAL_COMMUNITY): Payer: Medicare Other

## 2016-05-13 ENCOUNTER — Ambulatory Visit (HOSPITAL_COMMUNITY)
Admission: RE | Admit: 2016-05-13 | Discharge: 2016-05-13 | Disposition: A | Discharge status: Home / Self Care | Payer: Medicare Other | Source: Ambulatory Visit | Attending: Internal Medicine | Admitting: Internal Medicine

## 2016-05-13 ENCOUNTER — Encounter (HOSPITAL_COMMUNITY): Payer: Medicare Other

## 2016-05-13 DIAGNOSIS — J449 Chronic obstructive pulmonary disease, unspecified: Secondary | ICD-10-CM | POA: Insufficient documentation

## 2016-05-13 DIAGNOSIS — R05 Cough: Secondary | ICD-10-CM | POA: Diagnosis not present

## 2016-05-13 DIAGNOSIS — R942 Abnormal results of pulmonary function studies: Secondary | ICD-10-CM | POA: Insufficient documentation

## 2016-05-13 DIAGNOSIS — R059 Cough, unspecified: Secondary | ICD-10-CM

## 2016-05-13 LAB — PULMONARY FUNCTION TEST
DL/VA % PRED: 102 %
DL/VA: 4.81 ml/min/mmHg/L
DLCO unc % pred: 56 %
DLCO unc: 19.75 ml/min/mmHg
FEF 25-75 POST: 0.72 L/s
FEF 25-75 Pre: 0.81 L/sec
FEF2575-%CHANGE-POST: -11 %
FEF2575-%Pred-Post: 28 %
FEF2575-%Pred-Pre: 31 %
FEV1-%Change-Post: 5 %
FEV1-%Pred-Post: 44 %
FEV1-%Pred-Pre: 42 %
FEV1-PRE: 1.45 L
FEV1-Post: 1.53 L
FEV1FVC-%CHANGE-POST: 11 %
FEV1FVC-%PRED-PRE: 88 %
FEV6-%Change-Post: -3 %
FEV6-%PRED-PRE: 49 %
FEV6-%Pred-Post: 47 %
FEV6-Post: 2.09 L
FEV6-Pre: 2.18 L
FEV6FVC-%Change-Post: 1 %
FEV6FVC-%Pred-Post: 105 %
FEV6FVC-%Pred-Pre: 103 %
FVC-%CHANGE-POST: -5 %
FVC-%Pred-Post: 45 %
FVC-%Pred-Pre: 47 %
FVC-POST: 2.11 L
FVC-Pre: 2.24 L
PRE FEV1/FVC RATIO: 65 %
Post FEV1/FVC ratio: 72 %
Post FEV6/FVC ratio: 99 %
Pre FEV6/FVC Ratio: 97 %
RV % pred: 85 %
RV: 2.22 L
TLC % pred: 62 %
TLC: 4.63 L

## 2016-05-13 MED ORDER — ALBUTEROL SULFATE (2.5 MG/3ML) 0.083% IN NEBU
2.5000 mg | INHALATION_SOLUTION | Freq: Once | RESPIRATORY_TRACT | Status: AC
  Administered 2016-05-13: 2.5 mg via RESPIRATORY_TRACT

## 2016-05-20 DIAGNOSIS — Z0000 Encounter for general adult medical examination without abnormal findings: Secondary | ICD-10-CM | POA: Insufficient documentation

## 2016-05-20 HISTORY — DX: Encounter for general adult medical examination without abnormal findings: Z00.00

## 2016-05-21 DIAGNOSIS — E876 Hypokalemia: Secondary | ICD-10-CM | POA: Diagnosis not present

## 2016-05-21 DIAGNOSIS — N183 Chronic kidney disease, stage 3 (moderate): Secondary | ICD-10-CM | POA: Diagnosis not present

## 2016-05-21 DIAGNOSIS — Z23 Encounter for immunization: Secondary | ICD-10-CM | POA: Diagnosis not present

## 2016-05-21 DIAGNOSIS — Z0000 Encounter for general adult medical examination without abnormal findings: Secondary | ICD-10-CM | POA: Diagnosis not present

## 2016-05-26 ENCOUNTER — Telehealth (HOSPITAL_COMMUNITY): Payer: Self-pay | Admitting: *Deleted

## 2016-05-26 DIAGNOSIS — R942 Abnormal results of pulmonary function studies: Secondary | ICD-10-CM

## 2016-05-26 DIAGNOSIS — R05 Cough: Secondary | ICD-10-CM

## 2016-05-26 DIAGNOSIS — R059 Cough, unspecified: Secondary | ICD-10-CM

## 2016-05-26 NOTE — Telephone Encounter (Signed)
Notes Recorded by Scarlette Calico, RN on 05/26/2016 at 4:50 PM EDT Patient aware. He request to see Dr Melvyn Novas, referral placed, copy also faxed to pcp per his request

## 2016-05-26 NOTE — Telephone Encounter (Signed)
-----   Message from Jolaine Artist, MD sent at 05/13/2016 11:06 PM EDT ----- Significant lung disease. Please refer to pulmonary

## 2016-05-27 ENCOUNTER — Telehealth (HOSPITAL_COMMUNITY): Payer: Self-pay | Admitting: Vascular Surgery

## 2016-05-27 NOTE — Telephone Encounter (Signed)
Pt would like results of Kidney function sent to him

## 2016-05-27 NOTE — Telephone Encounter (Signed)
Left mess for pt copy of labs being mailed to him

## 2016-05-29 ENCOUNTER — Encounter: Payer: Self-pay | Admitting: Internal Medicine

## 2016-05-29 ENCOUNTER — Other Ambulatory Visit (INDEPENDENT_AMBULATORY_CARE_PROVIDER_SITE_OTHER): Payer: Medicare Other

## 2016-05-29 ENCOUNTER — Telehealth: Payer: Self-pay | Admitting: Internal Medicine

## 2016-05-29 ENCOUNTER — Ambulatory Visit (INDEPENDENT_AMBULATORY_CARE_PROVIDER_SITE_OTHER): Payer: Medicare Other | Admitting: Internal Medicine

## 2016-05-29 VITALS — BP 108/80 | HR 66 | Ht 72.0 in | Wt 203.6 lb

## 2016-05-29 DIAGNOSIS — J45991 Cough variant asthma: Secondary | ICD-10-CM | POA: Diagnosis not present

## 2016-05-29 DIAGNOSIS — R058 Other specified cough: Secondary | ICD-10-CM

## 2016-05-29 DIAGNOSIS — R05 Cough: Secondary | ICD-10-CM | POA: Diagnosis not present

## 2016-05-29 DIAGNOSIS — I6523 Occlusion and stenosis of bilateral carotid arteries: Secondary | ICD-10-CM

## 2016-05-29 HISTORY — DX: Cough variant asthma: J45.991

## 2016-05-29 HISTORY — DX: Other specified cough: R05.8

## 2016-05-29 LAB — CBC WITH DIFFERENTIAL/PLATELET
BASOS ABS: 0 10*3/uL (ref 0.0–0.1)
Basophils Relative: 0.5 % (ref 0.0–3.0)
EOS ABS: 0.2 10*3/uL (ref 0.0–0.7)
Eosinophils Relative: 4 % (ref 0.0–5.0)
HCT: 49.2 % (ref 39.0–52.0)
Hemoglobin: 16.5 g/dL (ref 13.0–17.0)
LYMPHS ABS: 1.3 10*3/uL (ref 0.7–4.0)
Lymphocytes Relative: 30.9 % (ref 12.0–46.0)
MCHC: 33.6 g/dL (ref 30.0–36.0)
MCV: 89.9 fl (ref 78.0–100.0)
MONOS PCT: 10 % (ref 3.0–12.0)
Monocytes Absolute: 0.4 10*3/uL (ref 0.1–1.0)
NEUTROS PCT: 54.6 % (ref 43.0–77.0)
Neutro Abs: 2.3 10*3/uL (ref 1.4–7.7)
PLATELETS: 225 10*3/uL (ref 150.0–400.0)
RBC: 5.47 Mil/uL (ref 4.22–5.81)
RDW: 13.9 % (ref 11.5–15.5)
WBC: 4.2 10*3/uL (ref 4.0–10.5)

## 2016-05-29 LAB — NITRIC OXIDE: NITRIC OXIDE: 19

## 2016-05-29 MED ORDER — FAMOTIDINE 20 MG PO TABS: 20.0000 mg | ORAL_TABLET | Freq: Two times a day (BID) | ORAL | 0 refills | Status: DC

## 2016-05-29 MED ORDER — PANTOPRAZOLE SODIUM 40 MG PO TBEC: 40.0000 mg | DELAYED_RELEASE_TABLET | Freq: Every day | ORAL | 0 refills | Status: DC

## 2016-05-29 NOTE — Progress Notes (Signed)
Subjective:    Patient ID: Darrell Williams, male    DOB: 12-10-1945,    MRN: [id number]  HPI  38 yowm never regular smoker with w/u for persistent cough in his mid 78's with allergy w/u "minimally pos"/ better off  ACEi but never resolved so referred to pulmonary clinic 05/29/2016 by Dr   Tempie Hoist with abn pfts 05/13/16   05/29/2016 1st [facility] office visit/ Salam Micucci   Chief Complaint  Patient presents with  . Pulmonary Consult    Referred by Dr. Haroldine Laws. Pt c/o cough "for years"- non prod and better when he takes antiacid.    cough for probably 30 years, esp the last 15 years  W/in an hour of nl hours arising asymptomatic then off and on coughing daily depending on what he eats but varies a lot / maybe worse with full stomach, or when takes "a mouth full of pills" also at hs but just for a few minutes then resolves unless rolls over then starts again  Assoc with lots of nasal drainage / no better with clariton  Better with tums or rolaids   Doe MMRC1 = can walk nl pace, flat grade, can't hurry or go uphills or steps s sob   .  No obvious day to day or daytime variability or assoc excess/ purulent sputum or mucus plugs or hemoptysis or cp or chest tightness, subjective wheeze or overt sinus or hb symptoms. No unusual exp hx or h/o childhood pna/ asthma or knowledge of premature birth.  Sleeping ok without nocturnal  or early am exacerbation  of respiratory  c/o's or need for noct saba. Also denies any obvious fluctuation of symptoms with weather or environmental changes or other aggravating or alleviating factors except as outlined above   Current Medications, Allergies, Complete Past Medical History, Past Surgical History, Family History, and Social History were reviewed in [employer] record.           Review of Systems  Constitutional: Negative for activity change, appetite change, chills, fever and unexpected weight change.  HENT: Negative for  congestion, dental problem, postnasal drip, rhinorrhea, sneezing, sore throat, trouble swallowing and voice change.   Eyes: Negative for visual disturbance.  Respiratory: Positive for cough. Negative for choking and shortness of breath.   Cardiovascular: Negative for chest pain and leg swelling.  Gastrointestinal: Negative for abdominal pain, nausea and vomiting.       Heartburn  Genitourinary: Negative for difficulty urinating.  Musculoskeletal: Negative for arthralgias.  Skin: Negative for rash.  Psychiatric/Behavioral: Negative for behavioral problems and confusion.       Objective:   Physical Exam   amb wm nad - Patient failed to answer a single question asked in a straightforward manner, tending to go off on tangents or answer questions with ambiguous medical terms or diagnoses and seemed pleasantly perplexed when asked the same question more than once for clarification.    Wt Readings from Last 3 Encounters:  05/29/16 203 lb 9.6 oz (92.4 kg)  04/30/16 205 lb (93 kg)  04/18/15 196 lb 6.4 oz (89.1 kg)    Vital signs reviewed  - Note on arrival 02 sats  96%  On   RA     HEENT: nl dentition, turbinates, and oropharynx. Nl external ear canals without cough reflex   NECK :  without JVD/Nodes/TM/ nl carotid upstrokes bilaterally   LUNGS: no acc muscle use,  Nl contour chest which is clear to A and P bilaterally with  cough on insp > exp    CV:  RRR  no s3 or murmur or increase in P2, no edema   ABD:  soft and nontender with nl inspiratory excursion in the supine position. No bruits or organomegaly, bowel sounds nl  MS:  Nl gait/ ext warm without deformities, calf tenderness, cyanosis or clubbing No obvious joint restrictions   SKIN: warm and dry without lesions    NEURO:  alert, approp, nl sensorium with  no motor deficits     I personally reviewed images and agree with radiology impression as follows:  CT 06/03/16  Chest        Large hiatal hernia containing only  fat. Bibasilar opacities, likely atelectasis. Assessment & Plan:

## 2016-05-29 NOTE — Assessment & Plan Note (Addendum)
PFT's 05/13/16 FEV1  1.53 (44%) ratio 72 with severely truncated insp loop and curvature to expiratory  Max GERD 05/29/2016 >>>  - FENO 05/29/2016  =   19  - Allergy profile 05/29/2016 >  Eos 0.2 /  IgE pending     The most common causes of chronic cough in immunocompetent adults include the following: upper airway cough syndrome (UACS), previously referred to as postnasal drip syndrome (PNDS), which is caused by variety of rhinosinus conditions; (2) asthma; (3) GERD; (4) chronic bronchitis from cigarette smoking or other inhaled environmental irritants; (5) nonasthmatic eosinophilic bronchitis; and (6) bronchiectasis.   These conditions, singly or in combination, have accounted for up to 94% of the causes of chronic cough in prospective studies.   Other conditions have constituted no >6% of the causes in prospective studies These have included bronchogenic carcinoma, chronic interstitial pneumonia, sarcoidosis, left ventricular failure, ACEI-induced cough, and aspiration from a condition associated with pharyngeal dysfunction.    Chronic cough is often simultaneously caused by more than one condition. A single cause has been found from 38 to 82% of the time, multiple causes from 18 to 62%. Multiply caused cough has been the result of three diseases up to 42% of the time.       Based on hx and exam, this is most likely:  Classic Upper airway cough syndrome, so named because it's frequently impossible to sort out how much is  CR/sinusitis with freq throat clearing (which can be related to primary GERD)   vs  causing  secondary (" extra esophageal")  GERD from wide swings in gastric pressure that occur with throat clearing, often  promoting self use of mint and menthol lozenges that reduce the lower esophageal sphincter tone and exacerbate the problem further in a cyclical fashion.   These are the same pts (now being labeled as having "irritable larynx syndrome" by some cough centers) who not  infrequently have a history of having failed to tolerate ace inhibitors,  dry powder inhalers or biphosphonates or report having atypical reflux symptoms that don't respond to standard doses of PPI , and are easily confused as having aecopd or asthma flares by even experienced allergists/ pulmonologists.   The first step is to maximize acid suppression and eliminate cyclical coughing then regroup if the cough persists.  I had an extended discussion with the patient reviewing all relevant studies completed to date and  lasting 35 min/ 60 min ov   1) Explained: The standardized cough guidelines published in Chest by Lissa Morales in 2006 are still the best available and consist of a multiple step process (up to 12!) , not a single office visit,  and are intended  to address this problem logically,  with an alogrithm dependent on response to empiric treatment at  each progressive step  to determine a specific diagnosis with  minimal addtional testing needed. Therefore if adherence is an issue or can't be accurately verified,  it's very unlikely the standard evaluation and treatment will be successful here.    Furthermore, response to therapy (other than acute cough suppression, which should only be used short term with avoidance of narcotic containing cough syrups if possible), can be a gradual process for which the patient may perceive immediate benefit.  Unlike going to an eye doctor where the best perscription is almost always the first one and is immediately effective, this is almost never the case in the management of chronic cough syndromes. Therefore the patient needs to commit  up front to consistently adhere to recommendations  for up to 6 weeks of therapy directed at the likely underlying problem(s) before the response can be reasonably evaluated.     2) Each maintenance medication was reviewed in detail including most importantly the difference between maintenance and prns and under what  circumstances the prns are to be triggered using an action plan format that is not reflected in the computer generated alphabetically organized AVS.    Please see instructions for details which were reviewed in writing and the patient given a copy highlighting the part that I personally wrote and discussed at today's ov.   See instructions for specific recommendations which were reviewed directly with the patient who was given a copy with highlighter outlining the key components.

## 2016-05-29 NOTE — Assessment & Plan Note (Signed)
See pfts 05/13/16  Ratios are nl but there is curvature to f/v on no rx and s gerd rx which was started 05/29/2016   Will repeat spirometry @ 6 weeks f/u / may need to consider alternative to timolol in this setting

## 2016-05-29 NOTE — Telephone Encounter (Signed)
Spoke with the pt  He is req that we send pecid and pantoprazole to [facility] for 90 day supply rush priority  Rxs sent and nothing further needed

## 2016-05-29 NOTE — Patient Instructions (Addendum)
Pantoprazole (protonix) 40 mg   Take  30-60 min before first meal of the day and Pepcid (famotidine)  20 mg one @  bedtime until return to office - this is the best way to tell whether stomach acid is contributing to your problem.    GERD (REFLUX)  is an extremely common cause of respiratory symptoms just like yours , many times with no obvious heartburn at all.    It can be treated with medication, but also with lifestyle changes including elevation of the head of your bed (ideally with 6 inch  bed blocks),  Smoking cessation, avoidance of late meals, excessive alcohol, and avoid fatty foods, chocolate, peppermint, colas, red wine, and acidic juices such as orange juice.  NO MINT OR MENTHOL PRODUCTS SO NO COUGH DROPS   USE SUGARLESS CANDY INSTEAD (Jolley ranchers or Stover's or Life Savers) or even ice chips will also do - the key is to swallow to prevent all throat clearing. NO OIL BASED VITAMINS - use powdered substitutes.   For drainage / throat tickle try take CHLORPHENIRAMINE  4 mg - take one every 4 hours as needed - available over the counter- may cause drowsiness so start with just a bedtime dose or two and see how you tolerate it before trying in daytime    Please remember to go to the lab  department downstairs for your tests - we will call you with the results when they are available.  Please schedule a follow up office visit in 6 weeks, call sooner if needed  Repeat spirometry on return

## 2016-06-01 LAB — RESPIRATORY ALLERGY PROFILE REGION II ~~LOC~~
Allergen, A. alternata, m6: <0.1 kU/L
Allergen, Cottonwood, t14: <0.1 kU/L
Allergen, D pternoyssinus,d7: <0.1 kU/L
Allergen, Oak,t7: <0.1 kU/L
Bermuda Grass: <0.1 kU/L
Cat Dander: <0.1 kU/L
D. farinae: <0.1 kU/L
Dog Dander: <0.1 kU/L
Elm IgE: <0.1 kU/L
IGE (IMMUNOGLOBULIN E), SERUM: 170 kU/L — AB (ref <115)
Johnson Grass: <0.1 kU/L
Pecan/Hickory Tree IgE: <0.1 kU/L
Timothy Grass: <0.1 kU/L

## 2016-06-02 NOTE — Progress Notes (Signed)
Spoke with pt and notified of results per Dr. Wert. Pt verbalized understanding and denied any questions. 

## 2016-06-03 DIAGNOSIS — I6529 Occlusion and stenosis of unspecified carotid artery: Secondary | ICD-10-CM | POA: Diagnosis not present

## 2016-06-03 DIAGNOSIS — I129 Hypertensive chronic kidney disease with stage 1 through stage 4 chronic kidney disease, or unspecified chronic kidney disease: Secondary | ICD-10-CM | POA: Diagnosis not present

## 2016-06-03 DIAGNOSIS — N200 Calculus of kidney: Secondary | ICD-10-CM | POA: Diagnosis not present

## 2016-06-03 DIAGNOSIS — N183 Chronic kidney disease, stage 3 (moderate): Secondary | ICD-10-CM | POA: Diagnosis not present

## 2016-06-03 DIAGNOSIS — Z6827 Body mass index (BMI) 27.0-27.9, adult: Secondary | ICD-10-CM | POA: Diagnosis not present

## 2016-06-16 ENCOUNTER — Other Ambulatory Visit: Payer: Self-pay | Admitting: Internal Medicine

## 2016-06-19 ENCOUNTER — Telehealth: Payer: Self-pay | Admitting: Internal Medicine

## 2016-06-19 NOTE — Telephone Encounter (Signed)
Called and spoke with pt and he stated that he has looked up all of his medications that was prescribed by MW.  He stated that he has glaucoma and that some of these medications stated that he should not take if he has glaucoma.   He stated that famotidine has the side effects of causing diarrhea and he stated that he went out to eat last night and he started with diarrhea this morning and has had this all day.  He stated that it is all watery now and may have a small amount of blood in it now.  He denies any nausea, vomiting but does have some abd cramps that go away with the diarrhea. He has been drinking water all day, but has not eaten.  MW please advise. Thanks  Allergies  Allergen Reactions  . Ace Inhibitors Other (See Comments)    : cough  . Codeine Nausea And Vomiting

## 2016-06-19 NOTE — Telephone Encounter (Signed)
Spoke with the pt and notified of recs per MW  Nothing further needed

## 2016-06-19 NOTE — Telephone Encounter (Signed)
Sorry to hear that - really nothing else to offer over the phone at this point but I strongly doubt that the pepcid is the cause of the diarrhea and if not improving needs to go to ER

## 2016-06-22 DIAGNOSIS — R3912 Poor urinary stream: Secondary | ICD-10-CM | POA: Diagnosis not present

## 2016-06-22 DIAGNOSIS — N401 Enlarged prostate with lower urinary tract symptoms: Secondary | ICD-10-CM | POA: Diagnosis not present

## 2016-06-22 DIAGNOSIS — R972 Elevated prostate specific antigen [PSA]: Secondary | ICD-10-CM | POA: Diagnosis not present

## 2016-06-22 DIAGNOSIS — N200 Calculus of kidney: Secondary | ICD-10-CM | POA: Diagnosis not present

## 2016-06-24 ENCOUNTER — Telehealth (HOSPITAL_COMMUNITY): Payer: Self-pay

## 2016-06-24 ENCOUNTER — Other Ambulatory Visit (HOSPITAL_COMMUNITY): Payer: Self-pay | Admitting: *Deleted

## 2016-06-24 MED ORDER — ROSUVASTATIN CALCIUM 40 MG PO TABS: 40.0000 mg | ORAL_TABLET | Freq: Every day | ORAL | 3 refills | Status: DC

## 2016-06-24 MED ORDER — AMLODIPINE BESYLATE 10 MG PO TABS: 5.0000 mg | ORAL_TABLET | Freq: Two times a day (BID) | ORAL | 3 refills | Status: DC

## 2016-06-24 NOTE — Telephone Encounter (Signed)
Patient called to ask for refills to [facility] for rosuvastatin and amlodipine. Advised these were already filled today. Patient aware and appreciative.  Renee Pain, RN

## 2016-07-14 ENCOUNTER — Ambulatory Visit (INDEPENDENT_AMBULATORY_CARE_PROVIDER_SITE_OTHER): Payer: Medicare Other | Admitting: Internal Medicine

## 2016-07-14 ENCOUNTER — Encounter: Payer: Self-pay | Admitting: Internal Medicine

## 2016-07-14 VITALS — BP 118/78 | HR 66 | Ht 72.0 in | Wt 198.8 lb

## 2016-07-14 DIAGNOSIS — R05 Cough: Secondary | ICD-10-CM

## 2016-07-14 DIAGNOSIS — I6523 Occlusion and stenosis of bilateral carotid arteries: Secondary | ICD-10-CM

## 2016-07-14 DIAGNOSIS — R058 Other specified cough: Secondary | ICD-10-CM

## 2016-07-14 MED ORDER — PREDNISONE 10 MG PO TABS: ORAL_TABLET | ORAL | 0 refills | Status: DC

## 2016-07-14 MED ORDER — MONTELUKAST SODIUM 10 MG PO TABS: 10.0000 mg | ORAL_TABLET | Freq: Every day | ORAL | 11 refills | Status: DC

## 2016-07-14 NOTE — Patient Instructions (Addendum)
singulair 10 mg each pm  Prednisone 10 mg take  4 each am x 2 days,   2 each am x 2 days,  1 each am x 2 days and stop   Timoptic eyedrops can  potentially be a problem in ashtma but I don't think asthma is what's causing you to cough  Pantoprazole (protonix) 40 mg   Take  30-60 min before first meal of the day and Pepcid (famotidine)  20 mg one @  bedtime until return to office - this is the best way to tell whether stomach acid is contributing to your problem.    Please see patient coordinator before you leave today  to schedule ENT in 2 weeks no sooner Dr Joya Gaskins at [facility]

## 2016-07-14 NOTE — Progress Notes (Signed)
Subjective:    Patient ID: Darrell Williams, male    DOB: 1946-05-03     MRN: [id number]    Brief patient profile:  51 yowm never regular smoker with w/u for persistent cough in his mid 57's with allergy w/u by Dr Annamaria Boots in 1990s "minimally pos"/ better off  ACEi but never resolved so referred to pulmonary clinic 05/29/2016 by Dr   Tempie Hoist with abn pfts 05/13/16   05/29/2016 1st [facility] office visit/ Nico Syme   Chief Complaint  Patient presents with  . Pulmonary Consult    Referred by Dr. Haroldine Laws. Pt c/o cough "for years"- non prod and better when he takes antiacid.    cough for probably 30 years, esp the last 15 years  W/in an hour of nl hours arising asymptomatic then off and on coughing daily depending on what he eats but varies a lot / maybe worse with full stomach, or when takes "a mouth full of pills" also at hs but just for a few minutes then resolves unless rolls over then starts again - lying on either side will make him cough for a few minutes assoc with lots of nasal drainage / no better with clariton  Better with tums or rolaids  Doe MMRC1 = can walk nl pace, flat grade, can't hurry or go uphills or steps s sob  Pantoprazole (protonix) 40 mg   Take  30-60 min before first meal of the day and Pepcid (famotidine)  20 mg one @  bedtime until return to office - this is the best way to tell whether stomach acid is contributing to your problem.   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 Follow-up    Pt states that his cough is  maybe a little improved but cannot tell much difference.   avg  5 -6 x h1 per day but still cough on insp and hs / no sob  Does not remember ever taking singulair or prednisone  Tried inhaler once and didn't help does not remember what  No obvious day to day or daytime variability or assoc excess/ purulent sputum or mucus plugs or hemoptysis or cp or chest tightness, subjective wheeze or overt sinus or hb symptoms. No unusual exp hx or h/o childhood pna/ asthma or knowledge of premature birth.  Sleeping ok without nocturnal  or early am exacerbation  of respiratory  c/o's or need for noct saba. Also denies any obvious fluctuation of symptoms with weather or environmental changes or other aggravating or alleviating factors except as outlined above   Current Medications, Allergies, Complete Past Medical History, Past Surgical History, Family History, and Social History were reviewed in [employer] record.  ROS  The following are not active complaints unless bolded sore throat, dysphagia, dental problems, itching, sneezing,  nasal congestion or excess/ purulent secretions, ear ache,   fever, chills, sweats, unintended wt loss, classically pleuritic or exertional cp,  orthopnea pnd or leg swelling, presyncope, palpitations, abdominal pain, anorexia, nausea, vomiting, diarrhea  or change in bowel or bladder habits, change in stools or urine, dysuria,hematuria,  rash, arthralgias, visual complaints, headache, numbness, weakness or ataxia or problems with walking or coordination,  change in mood/affect or memory.             Objective:   Physical Exam   amb wm nad still cough early on insp/ pseudowheeze but no stridor    07/14/2016      199   05/29/16 203 lb 9.6 oz (92.4 kg)  04/30/16 205 lb (93 kg)  04/18/15 196 lb 6.4 oz (89.1 kg)    Vital signs reviewed  - Note on arrival 02 sats  94%  On   RA     HEENT: nl dentition, turbinates, and oropharynx. Nl external ear  canals without cough reflex   NECK :  without JVD/Nodes/TM/ nl carotid upstrokes bilaterally   LUNGS: no acc muscle use,  Nl contour chest which is clear to A and P bilaterally with cough on insp only    CV:  RRR  no s3 or murmur or increase in P2, no edema   ABD:  soft and nontender with nl inspiratory excursion in the supine position. No bruits or organomegaly, bowel sounds nl  MS:  Nl gait/ ext warm without deformities, calf tenderness, cyanosis or clubbing No obvious joint restrictions   SKIN: warm and dry without lesions    NEURO:  alert, approp, nl sensorium with  no motor deficits     I personally reviewed images and agree with radiology impression as follows:  CT 06/03/16  Chest       Large hiatal hernia containing only fat. Bibasilar opacities, likely atelectasis. Assessment & Plan:   Outpatient Encounter Prescriptions as of 07/14/2016  Medication Sig  . amLODipine (NORVASC) 10 MG tablet Take 0.5 tablets (5 mg total) by mouth 2 (two) times daily.  Marland Kitchen aspirin EC 81 MG tablet Take 81 mg by mouth daily.  . Ca Carbonate-Mag Hydroxide (ROLAIDS PO) Take by mouth as needed.  . chlorpheniramine (CHLOR-TRIMETON) 4 MG tablet Take 4 mg by mouth every 4 (four) hours as needed for allergies.  Marland Kitchen doxazosin (CARDURA) 4 MG tablet Take 1 tablet (4 mg total) by mouth every morning.  . famotidine (PEPCID) 20 MG tablet TAKE 1 TABLET BY MOUTH TWO  TIMES DAILY  . hydrochlorothiazide (HYDRODIURIL) 25 MG tablet Take 25 mg by mouth as needed (FOR ANKLE SWELLING).   Marland Kitchen KLOR-CON M20 20 MEQ tablet Take 1 tablet by mouth 3  times a day  . Melatonin 5 MG CAPS Take 1 capsule by mouth at bedtime.  . Multiple Vitamins-Minerals (MULTIVITAMIN WITH MINERALS) tablet Take 1 tablet by mouth daily.  . pantoprazole (PROTONIX) 40 MG tablet Take 1 tablet (40 mg total) by mouth daily.  . rosuvastatin (CRESTOR) 40 MG tablet Take 1 tablet (40 mg total) by mouth daily.  Francella Solian Johns Wort 300 MG CAPS Take 1 capsule by  mouth daily.  . timolol (TIMOPTIC) 0.5 % ophthalmic solution Place 1 drop into both eyes 2 (two) times daily.  . vitamin E 400 UNIT capsule Take 400 Units by mouth daily.  . montelukast (SINGULAIR) 10 MG tablet Take 1 tablet (10 mg total) by mouth at bedtime.  . predniSONE (DELTASONE) 10 MG tablet Take 4 tablets po x 2 days, then 2 x 2 days, then 1 x 2 days then stop  . [DISCONTINUED] predniSONE (DELTASONE)  10 MG tablet Take  4 each am x 2 days,   2 each am x 2 days,  1 each am x 2 days and stop   No facility-administered encounter medications on file as of 07/14/2016.

## 2016-07-14 NOTE — Assessment & Plan Note (Signed)
PFT's 05/13/16 FEV1  1.53 (44%) ratio 72 with severely truncated insp loop and mildly concave curvature to expiratory portion Max GERD 05/29/2016 >>>  - FENO 05/29/2016  =   19  - Allergy profile 05/29/2016 >  Eos 0.2 /  IgE  170 neg RAST   - Spirometry 07/14/2016  FEV1 1.81 (53%)  Ratio 80 with no curvature at all  > try singulair x 30 days and then ent eval if not better  I had an extended final summary discussion with the patient reviewing all relevant studies completed to date and  lasting 15 to 20 minutes of a 25 minute visit on the following issues:    1) no evidence at all to support dx of asthma or any other pulmonary source for the cough  2) the cough is some better off acei, some better with H1 and occurs early on insp with a marked truncation on insp flow so next step in tiral of singulair for rhinitis component and very short course prednisone to see what if any aspects are related to upper airway inflammation driven by non -gerd components and continue gerd rx indef pending ent f/u   3) Each maintenance medication was reviewed in detail including most importantly the difference between maintenance and as needed and under what circumstances the prns are to be used.  Please see instructions for details which were reviewed in writing and the patient given a copy.   4) pulmonary f/u is prn

## 2016-07-15 ENCOUNTER — Telehealth: Payer: Self-pay | Admitting: Internal Medicine

## 2016-07-15 MED ORDER — PREDNISONE 10 MG PO TABS: ORAL_TABLET | ORAL | 0 refills | Status: DC

## 2016-07-15 NOTE — Telephone Encounter (Signed)
Pt request that prednisone be sent into [facility]. I have spoke with Estill Bamberg with [facility] and d/c Rx.  Rx has been sent to preferred pharmacy. Pt aware & voiced understanding. Nothing further needed.

## 2016-07-21 DIAGNOSIS — R972 Elevated prostate specific antigen [PSA]: Secondary | ICD-10-CM

## 2016-07-21 DIAGNOSIS — N401 Enlarged prostate with lower urinary tract symptoms: Secondary | ICD-10-CM | POA: Insufficient documentation

## 2016-07-21 HISTORY — DX: Elevated prostate specific antigen (PSA): R97.20

## 2016-07-25 ENCOUNTER — Other Ambulatory Visit: Payer: Self-pay | Admitting: Internal Medicine

## 2016-08-03 DIAGNOSIS — J383 Other diseases of vocal cords: Secondary | ICD-10-CM | POA: Diagnosis not present

## 2016-08-03 DIAGNOSIS — R05 Cough: Secondary | ICD-10-CM | POA: Diagnosis not present

## 2016-08-03 DIAGNOSIS — Z888 Allergy status to other drugs, medicaments and biological substances status: Secondary | ICD-10-CM | POA: Diagnosis not present

## 2016-08-03 DIAGNOSIS — J384 Edema of larynx: Secondary | ICD-10-CM | POA: Diagnosis not present

## 2016-08-03 DIAGNOSIS — Z9889 Other specified postprocedural states: Secondary | ICD-10-CM | POA: Diagnosis not present

## 2016-08-03 DIAGNOSIS — Z885 Allergy status to narcotic agent status: Secondary | ICD-10-CM | POA: Diagnosis not present

## 2016-08-10 ENCOUNTER — Telehealth: Payer: Self-pay | Admitting: Internal Medicine

## 2016-08-10 NOTE — Telephone Encounter (Signed)
MW  Please advise-  Pt. States he went to see his ENT, and he was placed on Gabapentin and Nortriptyline,and flonase. Pt. Stated since his ENT did place him on these medication, he wanted to make sure with you if you wanted him to continue on his protonix and pepcid    2. Tanda Rockers, MD (Physician) at 07/14/2016 4:59 PM - Signed    singulair 10 mg each pm  Prednisone 10 mg take  4 each am x 2 days,   2 each am x 2 days,  1 each am x 2 days and stop   Timoptic eyedrops can  potentially be a problem in ashtma but I don't think asthma is what's causing you to cough  Pantoprazole (protonix) 40 mg   Take  30-60 min before first meal of the day and Pepcid (famotidine)  20 mg one @  bedtime until return to office - this is the best way to tell whether stomach acid is contributing to your problem.    Please see patient coordinator before you leave today  to schedule ENT in 2 weeks no sooner Dr Joya Gaskins at [facility]

## 2016-08-10 NOTE — Telephone Encounter (Signed)
Once cough is gone then wean off acid suppression (the reverse of a therapeutic trial ) to see if flares and if so resume it

## 2016-08-11 NOTE — Telephone Encounter (Signed)
LMOMTCB x 1 

## 2016-08-11 NOTE — Telephone Encounter (Signed)
lmtcb

## 2016-08-11 NOTE — Telephone Encounter (Signed)
Patient is returning phone call.  °

## 2016-08-11 NOTE — Telephone Encounter (Signed)
Spoke with the pt and notified of recs per MW  He verbalized understanding and nothing further needed 

## 2016-08-21 ENCOUNTER — Telehealth: Payer: Self-pay | Admitting: Internal Medicine

## 2016-08-21 MED ORDER — PANTOPRAZOLE SODIUM 40 MG PO TBEC: 40.0000 mg | DELAYED_RELEASE_TABLET | Freq: Every day | ORAL | 1 refills | Status: DC

## 2016-08-21 NOTE — Telephone Encounter (Signed)
Spoke with the pt to verify the msg  I have sent refill  Nothing further needed

## 2016-10-06 DIAGNOSIS — G4700 Insomnia, unspecified: Secondary | ICD-10-CM | POA: Diagnosis not present

## 2016-10-06 DIAGNOSIS — K219 Gastro-esophageal reflux disease without esophagitis: Secondary | ICD-10-CM | POA: Diagnosis not present

## 2016-10-06 DIAGNOSIS — R0982 Postnasal drip: Secondary | ICD-10-CM | POA: Diagnosis not present

## 2016-10-06 DIAGNOSIS — R05 Cough: Secondary | ICD-10-CM | POA: Diagnosis not present

## 2016-10-06 DIAGNOSIS — K449 Diaphragmatic hernia without obstruction or gangrene: Secondary | ICD-10-CM | POA: Diagnosis not present

## 2016-10-06 DIAGNOSIS — Z79899 Other long term (current) drug therapy: Secondary | ICD-10-CM | POA: Diagnosis not present

## 2016-10-07 ENCOUNTER — Other Ambulatory Visit (HOSPITAL_COMMUNITY): Payer: Self-pay | Admitting: Internal Medicine

## 2016-10-28 DIAGNOSIS — H524 Presbyopia: Secondary | ICD-10-CM | POA: Diagnosis not present

## 2016-10-28 DIAGNOSIS — H401121 Primary open-angle glaucoma, left eye, mild stage: Secondary | ICD-10-CM | POA: Diagnosis not present

## 2016-10-28 DIAGNOSIS — Z961 Presence of intraocular lens: Secondary | ICD-10-CM | POA: Diagnosis not present

## 2016-10-28 DIAGNOSIS — H43812 Vitreous degeneration, left eye: Secondary | ICD-10-CM | POA: Diagnosis not present

## 2016-10-28 DIAGNOSIS — H401111 Primary open-angle glaucoma, right eye, mild stage: Secondary | ICD-10-CM | POA: Diagnosis not present

## 2016-10-28 DIAGNOSIS — H52223 Regular astigmatism, bilateral: Secondary | ICD-10-CM | POA: Diagnosis not present

## 2016-10-28 DIAGNOSIS — H5203 Hypermetropia, bilateral: Secondary | ICD-10-CM | POA: Diagnosis not present

## 2016-12-17 ENCOUNTER — Other Ambulatory Visit: Payer: Self-pay | Admitting: Internal Medicine

## 2016-12-17 DIAGNOSIS — I6523 Occlusion and stenosis of bilateral carotid arteries: Secondary | ICD-10-CM

## 2016-12-25 ENCOUNTER — Ambulatory Visit (HOSPITAL_COMMUNITY)
Admission: RE | Admit: 2016-12-25 | Discharge: 2016-12-25 | Disposition: A | Discharge status: Home / Self Care | Payer: Medicare Other | Source: Ambulatory Visit | Attending: Cardiovascular Disease | Admitting: Cardiovascular Disease

## 2016-12-25 DIAGNOSIS — I2510 Atherosclerotic heart disease of native coronary artery without angina pectoris: Secondary | ICD-10-CM | POA: Diagnosis not present

## 2016-12-25 DIAGNOSIS — I6523 Occlusion and stenosis of bilateral carotid arteries: Secondary | ICD-10-CM

## 2016-12-25 DIAGNOSIS — E785 Hyperlipidemia, unspecified: Secondary | ICD-10-CM | POA: Insufficient documentation

## 2016-12-25 DIAGNOSIS — I10 Essential (primary) hypertension: Secondary | ICD-10-CM | POA: Diagnosis not present

## 2017-01-25 ENCOUNTER — Telehealth (HOSPITAL_COMMUNITY): Payer: Self-pay | Admitting: *Deleted

## 2017-01-25 NOTE — Telephone Encounter (Signed)
VAS US CAROTID  Order: [id number]  Status:  Final result Visible to patient:  No (Not Released) Dx:  Bilateral carotid artery stenosis  Notes recorded by Kennieth Rad, RN on 01/25/2017 at 3:16 PM EDT Patient called back and he is aware of results, no further questions at this time. ------  Notes recorded by Scarlette Calico, RN on 01/19/2017 at 1:06 PM EDT Left message to call back ------  Notes recorded by Scarlette Calico, RN on 01/12/2017 at 4:22 PM EDT Left message to call back ------  Notes recorded by Bensimhon, Shaune Pascal, MD on 01/03/2017 at 10:38 PM EDT Stable 40-59% RICA stenosis. Stable 48-54% LICA stenosis.  F/u 1 year.

## 2017-04-06 DIAGNOSIS — T63441A Toxic effect of venom of bees, accidental (unintentional), initial encounter: Secondary | ICD-10-CM | POA: Diagnosis not present

## 2017-04-22 ENCOUNTER — Encounter: Payer: Self-pay | Admitting: Podiatry

## 2017-04-22 ENCOUNTER — Ambulatory Visit (INDEPENDENT_AMBULATORY_CARE_PROVIDER_SITE_OTHER): Payer: Medicare Other | Admitting: Podiatry

## 2017-04-22 DIAGNOSIS — L600 Ingrowing nail: Secondary | ICD-10-CM

## 2017-04-22 DIAGNOSIS — I6523 Occlusion and stenosis of bilateral carotid arteries: Secondary | ICD-10-CM | POA: Diagnosis not present

## 2017-04-22 NOTE — Progress Notes (Signed)
Subjective:    Patient ID: Darrell Williams, male   DOB: 71 y.o.   MRN: [id number]   HPI patient presents stating that he has a lot of pain in his right big toe with an ingrown toenail that he cannot get out himself and he's tried to soak it and trim it    Review of Systems  All other systems reviewed and are negative.       Objective:  Physical Exam  Constitutional: He appears well-developed and well-nourished.  Cardiovascular: Intact distal pulses.   Pulmonary/Chest: Breath sounds normal.  Musculoskeletal: Normal range of motion.  Neurological: He is alert.  Skin: Skin is warm.  Nursing note and vitals reviewed.  neurovascular status found to be intact muscle strength adequate range of motion within normal limits with patient found to have an incurvated right hallux lateral border that's very tender when pressed and has distal redness but no active drainage noted. Patient is noted to have good digital perfusion well oriented 3     Assessment:   Ingrown toenail deformity right hallux lateral border with pain      Plan:    H&P condition reviewed and recommended removal of the nail border. I explained procedure and risk and today I infiltrated the right hallux 60 mg like Marcaine mixture remove the border exposed matrix and applied phenol 3 applications 30 seconds followed by alcohol lavaged sterile dressing. Gave instructions on soaks and reappoint

## 2017-04-22 NOTE — Progress Notes (Signed)
   Subjective:    Patient ID: Darrell Williams, male    DOB: 10/27/1945, 71 y.o.   MRN: [id number]  HPI  Chief Complaint  Patient presents with  . Nail Problem       Review of Systems  HENT: Positive for sneezing and tinnitus.   Respiratory: Positive for cough.   Skin:       Change in nails  All other systems reviewed and are negative.      Objective:   Physical Exam        Assessment & Plan:

## 2017-04-22 NOTE — Patient Instructions (Signed)

## 2017-05-28 ENCOUNTER — Encounter (HOSPITAL_COMMUNITY): Payer: Medicare Other | Admitting: Internal Medicine

## 2017-06-01 DIAGNOSIS — N183 Chronic kidney disease, stage 3 (moderate): Secondary | ICD-10-CM | POA: Diagnosis not present

## 2017-06-01 DIAGNOSIS — I129 Hypertensive chronic kidney disease with stage 1 through stage 4 chronic kidney disease, or unspecified chronic kidney disease: Secondary | ICD-10-CM | POA: Diagnosis not present

## 2017-06-01 DIAGNOSIS — D649 Anemia, unspecified: Secondary | ICD-10-CM | POA: Diagnosis not present

## 2017-06-08 DIAGNOSIS — H401131 Primary open-angle glaucoma, bilateral, mild stage: Secondary | ICD-10-CM | POA: Diagnosis not present

## 2017-06-22 ENCOUNTER — Other Ambulatory Visit: Payer: Self-pay | Admitting: Internal Medicine

## 2017-06-22 ENCOUNTER — Other Ambulatory Visit (HOSPITAL_COMMUNITY): Payer: Self-pay | Admitting: Internal Medicine

## 2017-07-13 ENCOUNTER — Encounter (HOSPITAL_COMMUNITY): Payer: Self-pay | Admitting: Internal Medicine

## 2017-07-13 ENCOUNTER — Ambulatory Visit (HOSPITAL_COMMUNITY)
Admission: RE | Admit: 2017-07-13 | Discharge: 2017-07-13 | Disposition: A | Discharge status: Home / Self Care | Payer: Medicare Other | Source: Ambulatory Visit | Attending: Internal Medicine | Admitting: Internal Medicine

## 2017-07-13 VITALS — BP 133/82 | HR 73 | Wt 199.0 lb

## 2017-07-13 DIAGNOSIS — K219 Gastro-esophageal reflux disease without esophagitis: Secondary | ICD-10-CM | POA: Diagnosis not present

## 2017-07-13 DIAGNOSIS — R05 Cough: Secondary | ICD-10-CM | POA: Insufficient documentation

## 2017-07-13 DIAGNOSIS — Z79899 Other long term (current) drug therapy: Secondary | ICD-10-CM | POA: Diagnosis not present

## 2017-07-13 DIAGNOSIS — H409 Unspecified glaucoma: Secondary | ICD-10-CM | POA: Insufficient documentation

## 2017-07-13 DIAGNOSIS — Z7982 Long term (current) use of aspirin: Secondary | ICD-10-CM | POA: Insufficient documentation

## 2017-07-13 DIAGNOSIS — R350 Frequency of micturition: Secondary | ICD-10-CM | POA: Insufficient documentation

## 2017-07-13 DIAGNOSIS — I10 Essential (primary) hypertension: Secondary | ICD-10-CM | POA: Insufficient documentation

## 2017-07-13 DIAGNOSIS — I6523 Occlusion and stenosis of bilateral carotid arteries: Secondary | ICD-10-CM | POA: Diagnosis not present

## 2017-07-13 DIAGNOSIS — E785 Hyperlipidemia, unspecified: Secondary | ICD-10-CM | POA: Insufficient documentation

## 2017-07-13 DIAGNOSIS — I2510 Atherosclerotic heart disease of native coronary artery without angina pectoris: Secondary | ICD-10-CM | POA: Diagnosis not present

## 2017-07-13 DIAGNOSIS — Z87442 Personal history of urinary calculi: Secondary | ICD-10-CM | POA: Diagnosis not present

## 2017-07-13 DIAGNOSIS — R059 Cough, unspecified: Secondary | ICD-10-CM

## 2017-07-13 DIAGNOSIS — N401 Enlarged prostate with lower urinary tract symptoms: Secondary | ICD-10-CM | POA: Diagnosis not present

## 2017-07-13 DIAGNOSIS — R942 Abnormal results of pulmonary function studies: Secondary | ICD-10-CM | POA: Insufficient documentation

## 2017-07-13 NOTE — Addendum Note (Signed)
Encounter addended by: Scarlette Calico, RN on: 07/13/2017 3:35 PM  Actions taken: Sign clinical note

## 2017-07-13 NOTE — Patient Instructions (Addendum)
Your physician recommends that you return for a FASTING lipid profile: at [place]  Please call us back if you want to schedule a stress test  We will contact you in 1 year to schedule your next appointment.

## 2017-07-13 NOTE — Progress Notes (Signed)
CARDIOLOGY CLINIC NOTE  Patient ID: Darrell [facility], male   DOB: 1945-11-09, 71 y.o.   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 Coralie Keens, returns for routine followup.He has a history for hypertension, hyperlipidemia, carotid stenosis, GERD and allergies.  He has mild CAD by cardiac CT in 10/11. Stress test in December, 2006 and again in November 2009 which were normal.   Had cardiac CT in 10/11 as part of PROMISE trial:   1.  Calcium score of 120 Agatston units.  This places the patient in the 55th percentile for his age and gender.  This suggests intermediate risk for future cardiac events. 2.  Mild proximal LAD stenosis (estimate around 25%).  Otherwise, no significant disease.  Carotid u/s 5/18 Stable 40-59% RICA stenosis. Stable 78-67% LICA stenosis.  Carotid u/s (3/17) Stable, 40-59% RICA stenosis. Stable, 67-20% LICA stenosis, with mildly elevated velocities compared to previous exam    PFTs (9/17):  FEV1 1.45 (42%) FVC 2.24 (47%) DLCO  56%  Follow up:  Overall doing well. Main complaint is chronic cough. Has seen Dr. Melvyn Williams who sent him to ENT at [facility]. Unclear etiology. 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  No CP or SOB. LE edema much improved. 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 Thinking about starting an exercise program.  BP well cotnrolled  PMH 1) HTN 2) HLD 3) Allergies 4) Carotid stenosis - 02/2012: Stable 60-79% bilateral ICA stenosis - 08/2012: Stable 40-59% RICA stenosis, 94-70% LICA stenosis - 04/6282: Stable 66-29% RICA, 47-65% LICA stenosis - 11/6501: RICA 54-65% LICA 68-12%   02/5169; RICA 01-74% LICA 94-49% LICA stenosis, with mildly elevated velocities compared to previous exam  Lab Results  Component Value Date   CHOL 177 04/30/2016   HDL 63 04/30/2016   LDLCALC 96 04/30/2016   TRIG 89 04/30/2016   CHOLHDL 2.8 04/30/2016   ROS: All systems negative except as listed in HPI, PMH and Problem List.  Past Medical History:  Diagnosis Date  . At risk for sleep apnea    STOP-BANG= 4    SENT TO PCP 07-16-2014  . Bilateral carotid artery stenosis    Bilateral ICA  60-79%  per last duplex 02-21-2014  . Bilateral ureteral calculi   . BPH (benign prostatic hypertrophy)   . Coronary atherosclerosis of native coronary artery    cardiologist-  dr bensinhom--  pLAD 25%  per CT scan  . Frequency of urination   . Glaucoma of both eyes   . Hyperlipidemia   . Hypertension   . Plaque    cholesterol plaque--on his retina exam  . Urgency of urination   . Wears glasses     Current Outpatient Medications  Medication Sig Dispense Refill  . amLODipine (NORVASC) 10 MG tablet TAKE ONE-HALF TABLET BY  MOUTH TWO TIMES DAILY 90 tablet 0  . aspirin EC 81 MG tablet Take 81 mg by mouth daily.    . Ca Carbonate-Mag Hydroxide (ROLAIDS PO) Take by mouth as needed.    . chlorpheniramine (CHLOR-TRIMETON) 4 MG tablet Take 4 mg by mouth every 4 (four) hours as needed for allergies.    Marland Kitchen doxazosin (CARDURA) 4 MG tablet Take 1 tablet (4 mg total) by mouth every morning. 90 tablet 3  . famotidine (PEPCID) 20 MG tablet TAKE 1 TABLET BY MOUTH TWO  TIMES DAILY 90 tablet 3  .  gabapentin (NEURONTIN) 100 MG capsule Take 200 mg by mouth.    . hydrochlorothiazide (HYDRODIURIL) 25 MG tablet Take 25 mg by mouth as needed (FOR ANKLE SWELLING).     . Melatonin 5 MG CAPS Take 1 capsule by mouth at bedtime.    . montelukast (SINGULAIR) 10 MG tablet Take 1 tablet (10 mg total) by mouth at bedtime. 30 tablet 11  . Multiple Vitamins-Minerals (MULTIVITAMIN WITH MINERALS) tablet Take 1 tablet by mouth daily.    . nortriptyline (PAMELOR) 25 MG capsule Take 25 mg by mouth.    . pantoprazole (PROTONIX) 40 MG tablet Take 1 tablet (40 mg total) by mouth daily. 90 tablet 1  . potassium chloride SA (K-DUR,KLOR-CON) 20 MEQ tablet Take 40 mEq  by mouth 3 (three) times daily.    . rosuvastatin (CRESTOR) 40 MG tablet TAKE 1 TABLET BY MOUTH  DAILY 90 tablet 0  . timolol (TIMOPTIC) 0.5 % ophthalmic solution Place 1 drop into both eyes 2 (two) times daily.    . vitamin E 400 UNIT capsule Take 400 Units by mouth daily.     No current facility-administered medications for this encounter.    Vitals:   07/13/17 1457  BP: 133/82  Pulse: 73  SpO2: 93%  Weight: 199 lb (90.3 kg)   PHYSICAL EXAM: General:  Well appearing. No resp difficulty HEENT: normal Neck: supple. no JVD. Carotids 2+ bilat; no bruits. No lymphadenopathy or thryomegaly appreciated. Cor: PMI nondisplaced. Regular rate & rhythm. No rubs, gallops or murmurs. Lungs: clear with decreased BS Abdomen: soft, nontender, nondistended. No hepatosplenomegaly. No bruits or masses. Good bowel sounds. Extremities: no cyanosis, clubbing, rash, edema Neuro: alert & orientedx3, cranial nerves grossly intact. moves all 4 extremities w/o difficulty. Affect pleasant  ECG: NSR 72 No ST-T wave abnormalities. (Personally reviewed)   ASSESSMENT & PLAN:  1) HTN - Blood pressure well controlled. Continue current regimen.  2) HLD - Goal LDL < 70 - Repeat lipid profile today  3) Bilateral carotid stenosis - Stable, will repeat yearly  4) CAD - Mild. Asymptomatic. Continue ASA and statin.  - He would like a stress test before starting exercise program. I agree with this   5) Cough, chronic - Had seen Dr. Melvyn Williams who sent him to [facility] ENT.  - Unclear etiology  6) Abnormal PFTs - Followed by Dr. Earvin Hansen MD  3:15 PM

## 2017-07-19 ENCOUNTER — Other Ambulatory Visit: Payer: Self-pay | Admitting: Internal Medicine

## 2017-07-19 ENCOUNTER — Other Ambulatory Visit (HOSPITAL_COMMUNITY): Payer: Self-pay | Admitting: Internal Medicine

## 2017-07-19 DIAGNOSIS — E7849 Other hyperlipidemia: Secondary | ICD-10-CM | POA: Diagnosis not present

## 2017-07-19 DIAGNOSIS — I10 Essential (primary) hypertension: Secondary | ICD-10-CM | POA: Diagnosis not present

## 2017-07-20 LAB — COMPREHENSIVE METABOLIC PANEL
ALBUMIN: 4.3 g/dL (ref 3.5–4.8)
ALK PHOS: 68 IU/L (ref 39–117)
ALT: 21 IU/L (ref 0–44)
AST: 23 IU/L (ref 0–40)
Albumin/Globulin Ratio: 1.7 (ref 1.2–2.2)
BUN / CREAT RATIO: 18 (ref 10–24)
BUN: 28 mg/dL — AB (ref 8–27)
Bilirubin Total: 0.8 mg/dL (ref 0.0–1.2)
CALCIUM: 9.6 mg/dL (ref 8.6–10.2)
CO2: 25 mmol/L (ref 20–29)
CREATININE: 1.58 mg/dL — AB (ref 0.76–1.27)
Chloride: 106 mmol/L (ref 96–106)
GFR, EST AFRICAN AMERICAN: 49 mL/min/{1.73_m2} — AB (ref >59)
GFR, EST NON AFRICAN AMERICAN: 43 mL/min/{1.73_m2} — AB (ref >59)
GLUCOSE: 98 mg/dL (ref 65–99)
Globulin, Total: 2.6 g/dL (ref 1.5–4.5)
Potassium: 5 mmol/L (ref 3.5–5.2)
Sodium: 145 mmol/L — ABNORMAL HIGH (ref 134–144)
TOTAL PROTEIN: 6.9 g/dL (ref 6.0–8.5)

## 2017-07-20 LAB — CBC WITH DIFFERENTIAL/PLATELET
BASOS ABS: 0 10*3/uL (ref 0.0–0.2)
Basos: 1 %
EOS (ABSOLUTE): 0.2 10*3/uL (ref 0.0–0.4)
Eos: 4 %
Hematocrit: 45.7 % (ref 37.5–51.0)
Hemoglobin: 15.6 g/dL (ref 13.0–17.7)
Immature Grans (Abs): 0 10*3/uL (ref 0.0–0.1)
Immature Granulocytes: 0 %
LYMPHS ABS: 1.4 10*3/uL (ref 0.7–3.1)
Lymphs: 36 %
MCH: 29.9 pg (ref 26.6–33.0)
MCHC: 34.1 g/dL (ref 31.5–35.7)
MCV: 88 fL (ref 79–97)
Monocytes Absolute: 0.5 10*3/uL (ref 0.1–0.9)
Monocytes: 12 %
Neutrophils Absolute: 1.9 10*3/uL (ref 1.4–7.0)
Neutrophils: 47 %
PLATELETS: 224 10*3/uL (ref 150–379)
RBC: 5.22 x10E6/uL (ref 4.14–5.80)
RDW: 15.1 % (ref 12.3–15.4)
WBC: 4 10*3/uL (ref 3.4–10.8)

## 2017-07-20 LAB — HGB A1C W/O EAG: Hgb A1c MFr Bld: 6.4 % — ABNORMAL HIGH (ref 4.8–5.6)

## 2017-07-20 LAB — LIPID PANEL W/O CHOL/HDL RATIO
CHOLESTEROL TOTAL: 181 mg/dL (ref 100–199)
HDL: 69 mg/dL (ref >39)
LDL CALC: 87 mg/dL (ref 0–99)
Triglycerides: 126 mg/dL (ref 0–149)
VLDL CHOLESTEROL CAL: 25 mg/dL (ref 5–40)

## 2017-08-03 DIAGNOSIS — H401131 Primary open-angle glaucoma, bilateral, mild stage: Secondary | ICD-10-CM | POA: Diagnosis not present

## 2017-08-19 DIAGNOSIS — H401111 Primary open-angle glaucoma, right eye, mild stage: Secondary | ICD-10-CM | POA: Diagnosis not present

## 2017-08-19 DIAGNOSIS — H401121 Primary open-angle glaucoma, left eye, mild stage: Secondary | ICD-10-CM | POA: Diagnosis not present

## 2017-09-06 ENCOUNTER — Other Ambulatory Visit: Payer: Self-pay | Admitting: Internal Medicine

## 2017-11-14 ENCOUNTER — Other Ambulatory Visit: Payer: Self-pay | Admitting: Internal Medicine

## 2017-11-15 ENCOUNTER — Other Ambulatory Visit (HOSPITAL_COMMUNITY): Payer: Self-pay | Admitting: *Deleted

## 2017-11-15 MED ORDER — AMLODIPINE BESYLATE 10 MG PO TABS
ORAL_TABLET | ORAL | 3 refills | Status: DC
Start: 2017-11-15 — End: 2018-11-21

## 2017-11-15 MED ORDER — ROSUVASTATIN CALCIUM 40 MG PO TABS: 40.0000 mg | ORAL_TABLET | Freq: Every day | ORAL | 3 refills | Status: DC

## 2017-11-19 ENCOUNTER — Other Ambulatory Visit (HOSPITAL_COMMUNITY): Payer: Self-pay | Admitting: *Deleted

## 2017-11-19 MED ORDER — KLOR-CON M20 20 MEQ PO TBCR: 40.0000 meq | EXTENDED_RELEASE_TABLET | Freq: Three times a day (TID) | ORAL | 3 refills | Status: DC

## 2018-02-10 ENCOUNTER — Other Ambulatory Visit: Payer: Self-pay | Admitting: Internal Medicine

## 2018-02-10 DIAGNOSIS — I6523 Occlusion and stenosis of bilateral carotid arteries: Secondary | ICD-10-CM

## 2018-02-11 ENCOUNTER — Ambulatory Visit (HOSPITAL_COMMUNITY)
Admission: RE | Admit: 2018-02-11 | Discharge: 2018-02-11 | Disposition: A | Discharge status: Home / Self Care | Payer: Medicare Other | Source: Ambulatory Visit | Attending: Cardiovascular Disease | Admitting: Cardiovascular Disease

## 2018-02-11 DIAGNOSIS — I6523 Occlusion and stenosis of bilateral carotid arteries: Secondary | ICD-10-CM

## 2018-04-07 ENCOUNTER — Telehealth (HOSPITAL_COMMUNITY): Payer: Self-pay | Admitting: *Deleted

## 2018-04-07 NOTE — Telephone Encounter (Signed)
Pt called earlier today c/o dizzy spells off/on for about 6 weeks.  He states they mainly occure when he is sitting or laying down and gets up.  He states BP usually runs around 130s, he has checked it when he has had a dizzy spell it was 120/75.  He states med are stable with no recent changes, he does state Tamzalosin was started end of 2018 early 2019 and he was on Neurontin for a while but stopped that about 3 months ago.  He states he has not taken the HCTZ in several years as he has not had any edema.  He wonders if dizziness could be from some of his meds as they all list dizziness as a SE.  Discussed all w/Dr Bensimhon, with BP ok he feels not related to heart or meds as they are stable, recommends pt f/u w/pcp or ENT (pt currently seeing).   Attempted to call pt and Left message to call back

## 2018-04-11 NOTE — Telephone Encounter (Signed)
Patient called back and understands, verbalized he would follow up with his ENT.

## 2018-04-26 DIAGNOSIS — R42 Dizziness and giddiness: Secondary | ICD-10-CM

## 2018-04-26 DIAGNOSIS — R410 Disorientation, unspecified: Secondary | ICD-10-CM | POA: Insufficient documentation

## 2018-04-26 HISTORY — DX: Dizziness and giddiness: R42

## 2018-05-11 ENCOUNTER — Other Ambulatory Visit (HOSPITAL_COMMUNITY): Payer: Self-pay

## 2018-05-11 MED ORDER — DOXAZOSIN MESYLATE 4 MG PO TABS: 4.0000 mg | ORAL_TABLET | Freq: Every morning | ORAL | 0 refills | Status: DC

## 2018-05-16 ENCOUNTER — Telehealth (HOSPITAL_COMMUNITY): Payer: Self-pay

## 2018-05-16 NOTE — Telephone Encounter (Signed)
I thought you said his BP was dropping but now you say it is high?? Let's discuss it in clinic. [facility] has been talking to him too.

## 2018-05-16 NOTE — Telephone Encounter (Signed)
Opened in error

## 2018-05-16 NOTE — Telephone Encounter (Signed)
Pt called to state that the pharmacy would not fill his doxazosin due to pt being on tamsulosin and his bp dropping. Pt states that he has been out of doxazosin for 6 days now.  Per Dr. Haroldine Laws, if pt is feeling dizzy, pt should stay off doxazosin.  Called pt and made him aware of Bensimhon's plan. Pt states that he has had dizziness. Pt has not taken doxazosin in 6 days. Pt also states that he is taking 1/2 tab of amlodipine twice a day and 0.4 mg of tamsulosin daily and his bp readings are high 145/92. Please advise.

## 2018-05-18 NOTE — Addendum Note (Signed)
Addended by: Adora Fridge on: 05/18/2018 04:15 PM   Modules accepted: Orders

## 2018-05-18 NOTE — Telephone Encounter (Signed)
Spoke with patient whose SBP is controlled most of the day (125-133) but does increase to 143-145 range usually in the evening. His dizziness is much better since discontinuing use of his doxazosin. Will remain off of doxazosin for now but if BP starts increasing, may need to restart lower dose.   Ruta Hinds. Velva Harman, PharmD, BCPS, CPP Clinical Pharmacist Phone: [phone] 05/18/2018 4:11 PM

## 2018-07-27 ENCOUNTER — Ambulatory Visit (HOSPITAL_COMMUNITY)
Admission: RE | Admit: 2018-07-27 | Discharge: 2018-07-27 | Disposition: A | Discharge status: Home / Self Care | Payer: Medicare Other | Source: Ambulatory Visit | Attending: Internal Medicine | Admitting: Internal Medicine

## 2018-07-27 VITALS — BP 136/80 | HR 66 | Wt 195.0 lb

## 2018-07-27 DIAGNOSIS — N183 Chronic kidney disease, stage 3 (moderate): Secondary | ICD-10-CM | POA: Diagnosis not present

## 2018-07-27 DIAGNOSIS — Z79899 Other long term (current) drug therapy: Secondary | ICD-10-CM | POA: Diagnosis not present

## 2018-07-27 DIAGNOSIS — N401 Enlarged prostate with lower urinary tract symptoms: Secondary | ICD-10-CM | POA: Diagnosis not present

## 2018-07-27 DIAGNOSIS — Z7982 Long term (current) use of aspirin: Secondary | ICD-10-CM | POA: Diagnosis not present

## 2018-07-27 DIAGNOSIS — H409 Unspecified glaucoma: Secondary | ICD-10-CM | POA: Diagnosis not present

## 2018-07-27 DIAGNOSIS — I10 Essential (primary) hypertension: Secondary | ICD-10-CM

## 2018-07-27 DIAGNOSIS — I2510 Atherosclerotic heart disease of native coronary artery without angina pectoris: Secondary | ICD-10-CM | POA: Insufficient documentation

## 2018-07-27 DIAGNOSIS — E785 Hyperlipidemia, unspecified: Secondary | ICD-10-CM | POA: Diagnosis not present

## 2018-07-27 DIAGNOSIS — R942 Abnormal results of pulmonary function studies: Secondary | ICD-10-CM | POA: Diagnosis not present

## 2018-07-27 DIAGNOSIS — I129 Hypertensive chronic kidney disease with stage 1 through stage 4 chronic kidney disease, or unspecified chronic kidney disease: Secondary | ICD-10-CM | POA: Insufficient documentation

## 2018-07-27 DIAGNOSIS — R05 Cough: Secondary | ICD-10-CM | POA: Diagnosis not present

## 2018-07-27 DIAGNOSIS — I6523 Occlusion and stenosis of bilateral carotid arteries: Secondary | ICD-10-CM | POA: Diagnosis not present

## 2018-07-27 LAB — CBC
HCT: 50.9 % (ref 39.0–52.0)
Hemoglobin: 16.1 g/dL (ref 13.0–17.0)
MCH: 29.7 pg (ref 26.0–34.0)
MCHC: 31.6 g/dL (ref 30.0–36.0)
MCV: 93.7 fL (ref 80.0–100.0)
Platelets: 189 10*3/uL (ref 150–400)
RBC: 5.43 MIL/uL (ref 4.22–5.81)
RDW: 13.9 % (ref 11.5–15.5)
WBC: 5 10*3/uL (ref 4.0–10.5)
nRBC: 0 % (ref 0.0–0.2)

## 2018-07-27 LAB — COMPREHENSIVE METABOLIC PANEL
ALK PHOS: 58 U/L (ref 38–126)
ALT: 27 U/L (ref 0–44)
ANION GAP: 9 (ref 5–15)
AST: 30 U/L (ref 15–41)
Albumin: 4.2 g/dL (ref 3.5–5.0)
BILIRUBIN TOTAL: 1.4 mg/dL — AB (ref 0.3–1.2)
BUN: 19 mg/dL (ref 8–23)
CALCIUM: 9.6 mg/dL (ref 8.9–10.3)
CO2: 25 mmol/L (ref 22–32)
CREATININE: 1.45 mg/dL — AB (ref 0.61–1.24)
Chloride: 107 mmol/L (ref 98–111)
GFR calc Af Amer: 55 mL/min — ABNORMAL LOW (ref >60)
GFR, EST NON AFRICAN AMERICAN: 47 mL/min — AB (ref >60)
GLUCOSE: 109 mg/dL — AB (ref 70–99)
Potassium: 4.6 mmol/L (ref 3.5–5.1)
Sodium: 141 mmol/L (ref 135–145)
Total Protein: 7.1 g/dL (ref 6.5–8.1)

## 2018-07-27 LAB — LIPID PANEL
Cholesterol: 161 mg/dL (ref 0–200)
HDL: 64 mg/dL (ref >40)
LDL CALC: 76 mg/dL (ref 0–99)
TRIGLYCERIDES: 103 mg/dL (ref <150)
Total CHOL/HDL Ratio: 2.5 RATIO
VLDL: 21 mg/dL (ref 0–40)

## 2018-07-27 LAB — HEMOGLOBIN A1C
Hgb A1c MFr Bld: 6.2 % — ABNORMAL HIGH (ref 4.8–5.6)
Mean Plasma Glucose: 131.24 mg/dL

## 2018-07-27 NOTE — Patient Instructions (Signed)
Labs done today  EKG done today  Your physician has requested that you have an exercise tolerance test. For further information please visit [email or web address]. Please also follow instruction sheet, as given.  You have been referred to Dr. Drue Dun at [facility] in [place]. They will call you in order to set your appointment.   Follow up with [facility] as need.

## 2018-07-27 NOTE — Progress Notes (Signed)
CSW consulted to speak with patient about insurance questions.  Patient is debating whether or not he needs to change any of his insurances this year.  Pt reports he is happy with his current coverage but is worried about health/medication changes and how this may affect his coverage in the future.  CSW provided pt with number for SHIP to speak with a counselor regarding his concerns with Medicare.  Pt also inquired about seeing a nutritionist- reported we could refer him to a nutritionist but this would mean coming to [facility] for an appointment- suggested he follow up with his PCP in [place] to see if he can see a nutritionist through his office.  CSW will continue to follow in clinic and assist as needed  Jorge Ny, [place] Worker [facility] [phone]

## 2018-07-27 NOTE — Progress Notes (Signed)
CARDIOLOGY CLINIC NOTE  Patient ID: Darrell Williams, male   DOB: [birth year], 72 y.o.   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 Coralie Williams, returns for routine followup.He has a history for hypertension, hyperlipidemia, carotid stenosis, GERD and allergies.  He has mild CAD by cardiac CT in 10/11. Stress test in December, 2006 and again in November 2009 which were normal.   Had cardiac CT in 10/11 as part of PROMISE trial:   1.  Calcium score of 120 Agatston units.  This places the patient in the 55th percentile for his age and gender.  This suggests intermediate risk for future cardiac events. 2.  Mild proximal LAD stenosis (estimate around 25%).  Otherwise, no significant disease.  PFTs (9/17):  FEV1 1.45 (42%) FVC 2.24 (47%) DLCO  56%  Follow up:  In past had chronic cough. Saw Dr. Melvyn Williams who sent him to ENT at [facility]. Unclear etiology. 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 We stopped doxazosin in December due to dizziness. Checks BP regularly 130/80s. Not very active. Walks 1-2 miles 2x/week. No edema, orthopnea or PND.   PMH 1) HTN 2) HLD 3) Allergies 4) Carotid stenosis - 02/2012: Stable 60-79% bilateral ICA stenosis - 08/2012: Stable 40-59% RICA stenosis, 48-18% LICA stenosis - 12/6312: Stable 97-02% RICA, 63-78% LICA stenosis - 12/8848: RICA 27-74% LICA 12-87%   03/6766; RICA 20-94% LICA 70-96% LICA stenosis, with mildly elevated velocities compared to previous exam   01/2018: Stable 40-59% RICA stenosis, 28-36% LICA stenosis  Lab Results  Component Value Date   CHOL 181 07/19/2017   HDL 69 07/19/2017   LDLCALC 87 07/19/2017   TRIG 126 07/19/2017   CHOLHDL 2.8 04/30/2016   ROS: All systems negative except as listed in HPI, PMH and Problem List.  Past Medical History:  Diagnosis Date  . At risk for sleep apnea    STOP-BANG= 4    SENT TO PCP 07-16-2014  . Bilateral carotid artery stenosis     Bilateral ICA  60-79%  per last duplex 02-21-2014  . Bilateral ureteral calculi   . BPH (benign prostatic hypertrophy)   . Coronary atherosclerosis of native coronary artery    cardiologist-  dr Darrell Williams--  pLAD 25%  per CT scan  . Frequency of urination   . Glaucoma of both eyes   . Hyperlipidemia   . Hypertension   . Plaque    cholesterol plaque--on his retina exam  . Urgency of urination   . Wears glasses     Current Outpatient Medications  Medication Sig Dispense Refill  . amLODipine (NORVASC) 10 MG tablet TAKE ONE-HALF TABLET BY  MOUTH TWO TIMES DAILY 90 tablet 3  . aspirin EC 81 MG tablet Take 81 mg by mouth daily.    . famotidine (PEPCID) 20 MG tablet TAKE 1 TABLET BY MOUTH TWO  TIMES DAILY (Patient taking differently: Take 20 mg by mouth as needed. ) 90 tablet 3  . KLOR-CON M20 20 MEQ tablet Take 2 tablets (40 mEq total) by mouth 3 (three) times daily. (Patient taking differently: Take 40 mEq by mouth 3 (three) times daily. ) 540 tablet 3  . Latanoprost 0.005 % EMUL Apply 1 drop to eye daily. 1 drop in each eye daily    . Melatonin 5 MG CAPS Take 1 capsule by mouth at bedtime.    . Multiple Vitamins-Minerals (MULTIVITAMIN WITH MINERALS) tablet Take 1 tablet by mouth daily.    Marland Kitchen  rosuvastatin (CRESTOR) 40 MG tablet Take 1 tablet (40 mg total) by mouth daily. 90 tablet 3  . tamsulosin (FLOMAX) 0.4 MG CAPS capsule Take 0.4 mg by mouth daily.    . timolol (TIMOPTIC) 0.5 % ophthalmic solution Place 1 drop into both eyes 2 (two) times daily.    . vitamin E 400 UNIT capsule Take 400 Units by mouth daily.    . Ca Carbonate-Mag Hydroxide (ROLAIDS PO) Take by mouth as needed.    . chlorpheniramine (CHLOR-TRIMETON) 4 MG tablet Take 4 mg by mouth every 4 (four) hours as needed for allergies.    . hydrochlorothiazide (HYDRODIURIL) 25 MG tablet Take 25 mg by mouth as needed (FOR ANKLE SWELLING).      No current Williams-administered medications for this encounter.    Vitals:    07/27/18 1436  BP: 136/80  Pulse: 66  SpO2: 96%  Weight: 88.5 kg (195 lb)   PHYSICAL EXAM: General:  Well appearing. No resp difficulty HEENT: normal Neck: supple. no JVD. Carotids 2+ bilat; no bruits. No lymphadenopathy or thryomegaly appreciated. Cor: PMI nondisplaced. Regular rate & rhythm. No rubs, gallops or murmurs. Lungs: clear but decreased throughout. No wheeze Abdomen: soft, nontender, nondistended. No hepatosplenomegaly. No bruits or masses. Good bowel sounds. Extremities: no cyanosis, clubbing, rash, edema Neuro: alert & orientedx3, cranial nerves grossly intact. moves all 4 extremities w/o difficulty. Affect pleasant  ECG: NSR 72 No ST-T wave abnormalities. (Personally reviewed)   ASSESSMENT & PLAN:  1) HTN - Blood pressure well controlled. Continue current regimen. - Doxazosin stopped recently due to low BP/dizziness   2) HLD - Goal LDL < 70 - On high-dose Crestor  - Recheck lipids  3) Bilateral carotid stenosis - Stable, asymptomatic. Will repeat yearly. Continue ASA/statin  4) CAD - Mild by cardiac CT in 2011 Continue ASA and statin.  - Will get stress test prior to him increasing his exercise program   5) CKD 3 - baseline creatinine 1.5-1.8 Followed by Dr. Hollie Williams  - check today  6) Cough, chronic - Had seen Dr. Melvyn [facility] who sent him to [facility].  - Unclear etiology  7) Abnormal PFTs - Followed by Dr. Melvyn Williams  As he has no HF issues will transfer his care to [facility] for ongoing cardiology f/u.   Darrell Bickers MD  3:01 PM

## 2018-08-02 ENCOUNTER — Ambulatory Visit (INDEPENDENT_AMBULATORY_CARE_PROVIDER_SITE_OTHER): Payer: Medicare Other

## 2018-08-02 DIAGNOSIS — R7303 Prediabetes: Secondary | ICD-10-CM

## 2018-08-02 DIAGNOSIS — I2510 Atherosclerotic heart disease of native coronary artery without angina pectoris: Secondary | ICD-10-CM

## 2018-08-02 HISTORY — DX: Prediabetes: R73.03

## 2018-08-02 LAB — EXERCISE TOLERANCE TEST
CHL CUP RESTING HR STRESS: 63 {beats}/min
CSEPED: 7 min
CSEPEW: 8 METS
CSEPPHR: 125 {beats}/min
Exercise duration (sec): 11 s
MPHR: 146 {beats}/min
Percent HR: 86 %
RPE: 15

## 2018-08-04 ENCOUNTER — Telehealth (HOSPITAL_COMMUNITY): Payer: Self-pay

## 2018-08-04 NOTE — Telephone Encounter (Signed)
Spoke with pt about normal stress test. Pt aware, agreeable and verbalizes understanding

## 2018-08-04 NOTE — Telephone Encounter (Signed)
-----   Message from Jolaine Artist, MD sent at 08/03/2018 11:06 PM EST ----- Normal stress test

## 2018-08-04 NOTE — Telephone Encounter (Signed)
Faxed pt's last visit note and results from stress test per pt's wishes. Faxed to Dr. Arna Medici office at [phone].

## 2018-08-04 NOTE — Telephone Encounter (Signed)
No answer. Left voicemail to call back for results of stress test

## 2018-09-05 ENCOUNTER — Telehealth: Payer: Self-pay | Admitting: Cardiology

## 2018-09-06 ENCOUNTER — Ambulatory Visit (INDEPENDENT_AMBULATORY_CARE_PROVIDER_SITE_OTHER): Payer: Medicare Other | Admitting: Cardiology

## 2018-09-06 ENCOUNTER — Ambulatory Visit: Payer: Medicare Other | Admitting: Cardiology

## 2018-09-06 ENCOUNTER — Encounter: Payer: Self-pay | Admitting: Cardiology

## 2018-09-06 VITALS — BP 136/62 | HR 71 | Ht 72.0 in | Wt 192.1 lb

## 2018-09-06 DIAGNOSIS — I10 Essential (primary) hypertension: Secondary | ICD-10-CM | POA: Diagnosis not present

## 2018-09-06 DIAGNOSIS — R5383 Other fatigue: Secondary | ICD-10-CM

## 2018-09-06 DIAGNOSIS — I2510 Atherosclerotic heart disease of native coronary artery without angina pectoris: Secondary | ICD-10-CM | POA: Diagnosis not present

## 2018-09-06 DIAGNOSIS — I6523 Occlusion and stenosis of bilateral carotid arteries: Secondary | ICD-10-CM | POA: Diagnosis not present

## 2018-09-06 DIAGNOSIS — R5381 Other malaise: Secondary | ICD-10-CM

## 2018-09-06 NOTE — Patient Instructions (Signed)
Medication Instructions:  Your physician recommends that you continue on your current medications as directed. Please refer to the Current Medication list given to you today.  If you need a refill on your cardiac medications before your next appointment, please call your pharmacy.   Lab work: Your physician recommends that you return for lab work today: Testosterone level  If you have labs (blood work) drawn today and your tests are completely normal, you will receive your results only by: Marland Kitchen MyChart Message (if you have MyChart) OR . A paper copy in the mail If you have any lab test that is abnormal or we need to change your treatment, we will call you to review the results.  Testing/Procedures: Your physician has requested that you have a carotid duplex. This test is an ultrasound of the carotid arteries in your neck. It looks at blood flow through these arteries that supply the brain with blood. Allow one hour for this exam. There are no restrictions or special instructions.    Follow-Up: At [facility], you and your health needs are our priority.  As part of our continuing mission to provide you with exceptional heart care, we have created designated Provider Care Teams.  These Care Teams include your primary Cardiologist (physician) and Advanced Practice Providers (APPs -  Physician Assistants and Nurse Practitioners) who all work together to provide you with the care you need, when you need it. You will need a follow up appointment in 3 months.  Please call our office 2 months in advance to schedule this appointment.  You may see No primary care provider on file. or another member of our [employer] Provider Team in [place]: Shirlee More, MD . Jyl Heinz, MD  Any Other Special Instructions Will Be Listed Below (If Applicable).  Carotid Artery Disease The carotid arteries are the two main arteries on either side of the neck. They supply blood to the brain, face, and neck.  Carotid artery disease, also called carotid artery stenosis, is the narrowing or blockage of one or both carotid arteries. Carotid artery disease increases your risk for a stroke or a transient ischemic attack (TIA). A TIA is an episode in which blood flow to the brain is temporarily blocked. A TIA is considered a "warning stroke." What are the causes? This condition is primarily caused by buildup of plaque inside the carotid arteries (atherosclerosis). What increases the risk? The following factors may make you more likely to develop this condition:  High cholesterol (dyslipidemia).  High blood pressure (hypertension).  Smoking.  Obesity.  Diabetes.  Family history of cardiovascular disease.  Inactivity or lack of regular exercise.  Being male. Men have an increased risk of developing atherosclerosis earlier in life than women.  Old age. What are the signs or symptoms? This condition may not have any signs or symptoms until a stroke or TIA occurs. In some cases, your doctor may be able to hear a whooshing sound (bruit) which can indicate a change in blood flow due to plaque buildup. An eye exam can also help identify signs of the condition. How is this diagnosed? This condition may be diagnosed by:  A physical exam.  Your family and medical history.  Specific tests that look at the blood flow in the carotid arteries. These tests include: ? Carotid artery ultrasound. This test uses sound waves to create pictures of your arteries and show whether they are narrow or blocked. ? Carotid or cerebral angiography. During this test, a special  dye will be injected into a vein. The dye will show up on an X-ray to help highlight your arteries. ? Computerized tomographic angiography (CTA). During this test, a special dye will be injected into a vein. The dye will show up on the CT scan to help highlight your arteries. ? Magnetic resonance angiography (MRA). During this test, a special dye  will be injected into a vein. The dye will show up on the MRI to help highlight your arteries. How is this treated? Treatment of this condition may include a combination of treatments. Treatment options include:  Lifestyle changes such as: ? Quitting smoking. ? Exercising regularly or as directed by your health care provider. ? Eating a heart-healthy diet. ? Managing stress. ? Maintaining a healthy weight.  Medicines to control: ? Blood pressure. ? Cholesterol. ? Blood clotting.  Surgery. You may have: ? A carotid endarterectomy. This is a surgery to remove the blockages in the carotid arteries. ? A carotid angioplasty with stenting. This is a procedure in which a wire mesh (stent) is used to widen the blocked carotid arteries. Follow these instructions at home: Lifestyle  Follow your health care provider's instructions about your diet. It is important to eat a healthy diet that is low in saturated fats and includes plenty of fresh fruits, vegetables, and lean meats. You should avoid high-fat, high-sodium foods as well as foods that are fried, overly processed, or have poor nutritional value.  Maintain a healthy weight.  Stay physically active. It is recommended that you get at least 150 minutes of moderate exercise or 75 minutes of strenuous exercise each week.  Do not use any products that contain nicotine or tobacco, such as cigarettes and e-cigarettes. If you need help quitting, ask your health care provider.  Limit alcohol intake to no more than 1 drink a day for non-pregnant women and 2 drinks a day for men. One drink equals 12 oz. of beer, 5 oz. of wine, or 1 oz. of hard liquor.  Do not use illegal drugs.  Manage your stress. Ask your health care provider for stress management tips. General instructions  Take over-the-counter and prescription medicines only as told by your health care provider.  Keep all follow-up visits as told by your health care provider. This is  important. Get help right away if:  You have any symptoms of stroke or TIA. The acronym BEFAST is an easy way to remember the main warning signs of stroke. ? B = Balance problems. Signs include dizziness, sudden trouble walking, or loss of balance ? E = Eye problems. This includes trouble seeing or a sudden change in vision. ? F = Face changes. This includes sudden weakness or numbness of the face, or the face or eyelid drooping to one side. ? A = Arm weakness or numbness. This happens suddenly and usually on one side of the body. ? S = Speech problems. This includes trouble speaking or trouble understanding speech. ? T = Time. Time to call 911 or seek emergency care. Do not wait to see if symptoms go away. Make note of the time your symptoms started.  Other signs of stroke may include: ? A sudden, severe headache with no known cause. ? Nausea or vomiting. ? Seizure. These symptoms may represent a serious problem that is an emergency. Do not wait to see if the symptoms will go away. Get medical help right away. Call your local emergency services (911 in the [place] Do not drive yourself to the  hospital. Summary  Carotid artery disease, also called carotid artery stenosis, is the narrowing or blockage of one or both carotid arteries.  Carotid artery disease increases your risk for a stroke or a transient ischemic attack (TIA).  This condition can be treated with lifestyle changes, medicines, surgery, or a combination of these treatments.  Do not use any products that contain nicotine or tobacco, such as cigarettes and e-cigarettes. If you need help quitting, ask your health care provider. This information is not intended to replace advice given to you by your health care provider. Make sure you discuss any questions you have with your health care provider. Document Released: 11/02/2011 Document Revised: 09/14/2016 Document Reviewed: 09/14/2016 Elsevier Interactive Patient Education  2019  [employer].

## 2018-09-06 NOTE — Progress Notes (Signed)
Cardiology Office Note:    Date:  09/06/2018   ID:  STRYKER VEASEY, DOB Oct 29, 1945, MRN [id number]  PCP:  Algis Greenhouse, MD  Cardiologist:  Jenne Campus, MD    Referring MD: Algis Greenhouse, MD   Chief Complaint  Patient presents with  . Atherosclerosis of native coronary artery of native heart wi  Am doing well  History of Present Illness:    Darrell Williams is a 73 y.o. male with nonobstructive coronary artery disease also carotic arterial disease being follow-up initially by Dr. Kennis Carina and then by Dr. Haroldine Laws.  [place] see [place].  He denies having any cardiac symptoms except for fatigue and tiredness he complained of having weakness in his legs and he complained of the fact that he is getting heavier.  He did have quite extensive cardiac evaluation done previously and the only important finding identified except for nonobstructive coronary artery disease was significant cardiac arterial disease.  He denies having any TIA or CVA symptoms.  No chest pain tightness squeezing pressure burning chest  Past Medical History:  Diagnosis Date  . At risk for sleep apnea    STOP-BANG= 4    SENT TO PCP 07-16-2014  . Bilateral carotid artery stenosis    Bilateral ICA  60-79%  per last duplex 02-21-2014  . Bilateral ureteral calculi   . BPH (benign prostatic hypertrophy)   . Coronary atherosclerosis of native coronary artery    cardiologist-  dr bensinhom--  pLAD 25%  per CT scan  . Frequency of urination   . Glaucoma of both eyes   . Hyperlipidemia   . Hypertension   . Plaque    cholesterol plaque--on his retina exam  . Urgency of urination   . Wears glasses     Past Surgical History:  Procedure Laterality Date  . CARDIOVASCULAR STRESS TEST  08-12-2005  dr Ron Parker   normal nuclear study/  no ischemia /  ef 67%---  11/ 2009  normal ETT  . CATARACT EXTRACTION W/ INTRAOCULAR LENS  IMPLANT, BILATERAL    . COLONOSCOPY  2005  (approx)  . CYSTOSCOPY WITH RETROGRADE PYELOGRAM, URETEROSCOPY AND STENT PLACEMENT Bilateral 07/23/2014   Procedure: CYSTOSCOPY WITH BILATERAL RETROGRADE PYELOGRAM, BILATERAL URETEROSCOPY AND STENT PLACEMENT, STONE EXTRACTION;  Surgeon: Bernestine Amass, MD;  Location: [facility];  Service: Urology;  Laterality: Bilateral;  . HOLMIUM LASER APPLICATION Bilateral 32/[id number]/5732   Procedure: HOLMIUM LASER APPLICATION;  Surgeon: Bernestine Amass, MD;  Location: [facility];  Service: Urology;  Laterality: Bilateral;  . INGUINAL HERNIA REPAIR Bilateral 1995  (approx)    Current Medications: Current Meds  Medication Sig  . amLODipine (NORVASC) 10 MG tablet TAKE ONE-HALF TABLET BY  MOUTH TWO TIMES DAILY  . aspirin EC 81 MG tablet Take 81 mg by mouth daily.  Marland Kitchen b complex vitamins capsule Take 1 capsule by mouth daily.  . Ca Carbonate-Mag Hydroxide (ROLAIDS PO) Take by mouth as needed.  . famotidine (PEPCID) 20 MG tablet TAKE 1 TABLET BY MOUTH TWO  TIMES DAILY (Patient taking differently: Take 20 mg by mouth as needed. )  . fluticasone (FLONASE) 50 MCG/ACT nasal spray Place 2 sprays into both nostrils as needed for allergies or rhinitis.  . hydrochlorothiazide (HYDRODIURIL) 25 MG tablet Take 25 mg by mouth as needed (FOR ANKLE SWELLING).   Marland Kitchen KLOR-CON M20 20 MEQ tablet Take 2 tablets (40 mEq total) by mouth  3 (three) times daily. (Patient taking differently: Take 40 mEq by mouth 3 (three) times daily. )  . Latanoprost 0.005 % EMUL Apply 1 drop to eye daily. 1 drop in each eye daily  . Melatonin 5 MG CAPS Take 1 capsule by mouth at bedtime.  . Multiple Vitamins-Minerals (MULTIVITAMIN WITH MINERALS) tablet Take 1 tablet by mouth daily.  . pantoprazole (PROTONIX) 40 MG tablet Take 40 mg by mouth as needed.  . rosuvastatin (CRESTOR) 40 MG tablet Take 1 tablet (40 mg total) by mouth daily.  . tamsulosin (FLOMAX) 0.4 MG CAPS capsule Take 0.4 mg by mouth daily.  . timolol (TIMOPTIC) 0.5  % ophthalmic solution Place 1 drop into both eyes 2 (two) times daily.  Marland Kitchen VITAMIN D, CHOLECALCIFEROL, PO Take by mouth.  . vitamin E 400 UNIT capsule Take 400 Units by mouth daily.     Allergies:   Ace inhibitors and Codeine   Social History   Socioeconomic History  . Marital status: Single    Spouse name: Not on file  . Number of children: Not on file  . Years of education: Not on file  . Highest education level: Not on file  Occupational History  . Not on file  Social Needs  . Financial resource strain: Not on file  . Food insecurity:    Worry: Not on file    Inability: Not on file  . Transportation needs:    Medical: Not on file    Non-medical: Not on file  Tobacco Use  . Smoking status: Never Smoker  . Smokeless tobacco: Never Used  Substance and Sexual Activity  . Alcohol use: Yes    Comment: occasional  . Drug use: No  . Sexual activity: Not on file  Lifestyle  . Physical activity:    Days per week: Not on file    Minutes per session: Not on file  . Stress: Not on file  Relationships  . Social connections:    Talks on phone: Not on file    Gets together: Not on file    Attends religious service: Not on file    Active member of club or organization: Not on file    Attends meetings of clubs or organizations: Not on file    Relationship status: Not on file  Other Topics Concern  . Not on file  Social History Narrative  . Not on file     Family History: The patient's family history includes Diabetes (age of onset: 66) in his paternal uncle; Hypertension in his father and mother; Stroke in his father. ROS:   Please see the history of present illness.    All 14 point review of systems negative except as described per history of present illness  EKGs/Labs/Other Studies Reviewed:      Recent Labs: 07/27/2018: ALT 27; BUN 19; Creatinine, Ser 1.45; Hemoglobin 16.1; Platelets 189; Potassium 4.6; Sodium 141  Recent Lipid Panel    Component Value Date/Time    CHOL 161 07/27/2018 1522   CHOL 181 07/19/2017 1628   TRIG 103 07/27/2018 1522   TRIG 68 07/14/2006 1515   HDL 64 07/27/2018 1522   HDL 69 07/19/2017 1628   CHOLHDL 2.5 07/27/2018 1522   VLDL 21 07/27/2018 1522   LDLCALC 76 07/27/2018 1522   LDLCALC 87 07/19/2017 1628    Physical Exam:    VS:  BP 136/62   Pulse 71   Ht 6' (1.829 m)   Wt 192 lb 1.9 oz (87.1 kg)  SpO2 95%   BMI 26.06 kg/m     Wt Readings from Last 3 Encounters:  09/06/18 192 lb 1.9 oz (87.1 kg)  07/27/18 195 lb (88.5 kg)  07/13/17 199 lb (90.3 kg)     GEN:  Well nourished, well developed in no acute distress HEENT: Normal NECK: No JVD; No carotid bruits LYMPHATICS: No lymphadenopathy CARDIAC: RRR, no murmurs, no rubs, no gallops RESPIRATORY:  Clear to auscultation without rales, wheezing or rhonchi  ABDOMEN: Soft, non-tender, non-distended MUSCULOSKELETAL:  No edema; No deformity  SKIN: Warm and dry LOWER EXTREMITIES: no swelling NEUROLOGIC:  Alert and oriented x 3 PSYCHIATRIC:  Normal affect   ASSESSMENT:    1. Coronary artery disease, angina presence unspecified, unspecified vessel or lesion type, unspecified whether native or transplanted heart   2. Malaise and fatigue   3. Bilateral carotid artery stenosis   4. Atherosclerosis of native coronary artery of native heart without angina pectoris   5. HYPERTENSION, BENIGN    PLAN:    In order of problems listed above:  1. Coronary artery disease doing well from that point we will continue conservative approach. 2. Cortical she will disease time to repeat his carotid ultrasounds which we will do.   3. Will call primary care physician to get fasting lipid profile. 4. He complained of having weakness and fatigue and he is inquiring about potentially checking his testosterone which would be reasonable things to do. 5. We talked in length about measures that he can take to improve his health overall we talked about exercises on the regular basis I  recommended to get a fit bit and start walking on the regular basis he said he will comply.   Medication Adjustments/Labs and Tests Ordered: Current medicines are reviewed at length with the patient today.  Concerns regarding medicines are outlined above.  Orders Placed This Encounter  Procedures  . Testosterone   Medication changes: No orders of the defined types were placed in this encounter.   Signed, Park Liter, MD, [facility] 09/06/2018 12:52 PM    [place]

## 2018-09-07 ENCOUNTER — Telehealth: Payer: Self-pay | Admitting: Cardiology

## 2018-09-07 LAB — TESTOSTERONE: Testosterone: 327 ng/dL (ref 264–916)

## 2018-09-07 NOTE — Telephone Encounter (Signed)
Patient informed of results.  

## 2018-09-07 NOTE — Telephone Encounter (Signed)
Patient has called twice to get his results.

## 2018-09-08 ENCOUNTER — Ambulatory Visit (INDEPENDENT_AMBULATORY_CARE_PROVIDER_SITE_OTHER): Payer: Medicare Other

## 2018-09-08 DIAGNOSIS — I2510 Atherosclerotic heart disease of native coronary artery without angina pectoris: Secondary | ICD-10-CM | POA: Diagnosis not present

## 2018-09-08 NOTE — Progress Notes (Signed)
Carotid duplex exam has been performed. Bilateral ICA stenosis was observed.  Jimmy Gracyn Allor RDCS, RVT

## 2018-09-25 ENCOUNTER — Other Ambulatory Visit (HOSPITAL_COMMUNITY): Payer: Self-pay | Admitting: Internal Medicine

## 2018-09-27 DIAGNOSIS — M25511 Pain in right shoulder: Secondary | ICD-10-CM

## 2018-09-27 HISTORY — DX: Pain in right shoulder: M25.511

## 2018-09-28 ENCOUNTER — Telehealth: Payer: Self-pay | Admitting: Cardiology

## 2018-09-28 NOTE — Telephone Encounter (Signed)
Patient called and wants you to suggest an ortho dr here in [place] that he could see for his shoulder, I told him [facility] but he wants you to make the recommendation to him.Marland Kitchen

## 2018-09-28 NOTE — Telephone Encounter (Signed)
Patient informed our recommendation in [place] would be Dr. Lorin Mercy with  [facility].

## 2018-10-30 DIAGNOSIS — K4091 Unilateral inguinal hernia, without obstruction or gangrene, recurrent: Secondary | ICD-10-CM

## 2018-10-30 HISTORY — DX: Unilateral inguinal hernia, without obstruction or gangrene, recurrent: K40.91

## 2018-11-18 ENCOUNTER — Other Ambulatory Visit (HOSPITAL_COMMUNITY): Payer: Self-pay | Admitting: Internal Medicine

## 2019-01-07 DIAGNOSIS — R109 Unspecified abdominal pain: Secondary | ICD-10-CM

## 2019-01-07 HISTORY — DX: Unspecified abdominal pain: R10.9

## 2019-01-18 DIAGNOSIS — K449 Diaphragmatic hernia without obstruction or gangrene: Secondary | ICD-10-CM

## 2019-01-18 HISTORY — DX: Diaphragmatic hernia without obstruction or gangrene: K44.9

## 2019-01-20 DIAGNOSIS — A048 Other specified bacterial intestinal infections: Secondary | ICD-10-CM | POA: Insufficient documentation

## 2019-01-20 HISTORY — DX: Other specified bacterial intestinal infections: A04.8

## 2019-04-14 DIAGNOSIS — R600 Localized edema: Secondary | ICD-10-CM

## 2019-04-14 HISTORY — DX: Localized edema: R60.0

## 2019-05-15 DIAGNOSIS — C642 Malignant neoplasm of left kidney, except renal pelvis: Secondary | ICD-10-CM | POA: Insufficient documentation

## 2019-05-18 ENCOUNTER — Encounter

## 2019-05-18 ENCOUNTER — Ambulatory Visit (INDEPENDENT_AMBULATORY_CARE_PROVIDER_SITE_OTHER): Payer: Medicare Other | Admitting: Cardiology

## 2019-05-18 ENCOUNTER — Other Ambulatory Visit: Payer: Self-pay

## 2019-05-18 ENCOUNTER — Encounter: Payer: Self-pay | Admitting: Cardiology

## 2019-05-18 VITALS — BP 132/88 | HR 87 | Ht 72.0 in | Wt 183.0 lb

## 2019-05-18 DIAGNOSIS — I10 Essential (primary) hypertension: Secondary | ICD-10-CM

## 2019-05-18 DIAGNOSIS — I6523 Occlusion and stenosis of bilateral carotid arteries: Secondary | ICD-10-CM | POA: Diagnosis not present

## 2019-05-18 DIAGNOSIS — M7989 Other specified soft tissue disorders: Secondary | ICD-10-CM

## 2019-05-18 DIAGNOSIS — E785 Hyperlipidemia, unspecified: Secondary | ICD-10-CM

## 2019-05-18 DIAGNOSIS — R0609 Other forms of dyspnea: Secondary | ICD-10-CM

## 2019-05-18 HISTORY — DX: Other specified soft tissue disorders: M79.89

## 2019-05-18 NOTE — Progress Notes (Signed)
Cardiology Office Note:    Date:  05/18/2019   ID:  [name], DOB Apr 09, 1946, MRN [id number]  PCP:  Algis Greenhouse, MD  Cardiologist:  Jenne Campus, MD    Referring MD: Algis Greenhouse, MD   Chief Complaint  Patient presents with  . Follow-up  Doing better  History of Present Illness:    Darrell Williams is a 73 y.o. male with peripheral vascular disease bilateral carotid arterial stenosis.  Also history of coronary artery disease but nonobstructive.  Hypertension dyslipidemia recently he ended up having some inguinal hernia surgery after that he started having a lot of trouble eventually he was find to have gastric outlet obstruction.  That being fix and he is doing much better from that point of view.  He comes today to my office complaint is weakness fatigue tiredness as well as some swelling of lower extremities especially at evening time.  There is no proximal nocturnal dyspnea no palpitations no dizziness except when he gets up very quickly.  Of course concerns about possibility of heart issues.  Denies have any chest pain tightness squeezing pressure been chest  Past Medical History:  Diagnosis Date  . At risk for sleep apnea    STOP-BANG= 4    SENT TO PCP 07-16-2014  . Bilateral carotid artery stenosis    Bilateral ICA  60-79%  per last duplex 02-21-2014  . Bilateral ureteral calculi   . BPH (benign prostatic hypertrophy)   . Coronary atherosclerosis of native coronary artery    cardiologist-  dr bensinhom--  pLAD 25%  per CT scan  . Frequency of urination   . Glaucoma of both eyes   . Hyperlipidemia   . Hypertension   . Plaque    cholesterol plaque--on his retina exam  . Urgency of urination   . Wears glasses     Past Surgical History:  Procedure Laterality Date  . CARDIOVASCULAR STRESS TEST  08-12-2005  dr Ron Parker   normal nuclear study/  no ischemia /  ef 67%---  11/ 2009  normal ETT  . CATARACT EXTRACTION W/ INTRAOCULAR LENS  IMPLANT, BILATERAL    .  COLONOSCOPY  2005 (approx)  . CYSTOSCOPY WITH RETROGRADE PYELOGRAM, URETEROSCOPY AND STENT PLACEMENT Bilateral 07/23/2014   Procedure: CYSTOSCOPY WITH BILATERAL RETROGRADE PYELOGRAM, BILATERAL URETEROSCOPY AND STENT PLACEMENT, STONE EXTRACTION;  Surgeon: Bernestine Amass, MD;  Location: [facility];  Service: Urology;  Laterality: Bilateral;  . HOLMIUM LASER APPLICATION Bilateral [id number]   Procedure: HOLMIUM LASER APPLICATION;  Surgeon: Bernestine Amass, MD;  Location: [facility];  Service: Urology;  Laterality: Bilateral;  . INGUINAL HERNIA REPAIR Bilateral 1995  (approx)    Current Medications: Current Meds  Medication Sig  . acetaminophen (TYLENOL) 500 MG tablet Take 2 tablets by mouth daily as needed.  Marland Kitchen amLODipine (NORVASC) 10 MG tablet TAKE ONE-HALF TABLET BY  MOUTH TWICE A DAY (Patient taking differently: Take 5 mg by mouth daily. TAKE ONE-HALF TABLET BY  MOUTH TWICE A DAY)  . aspirin EC 81 MG tablet Take 81 mg by mouth daily.  Marland Kitchen b complex vitamins capsule Take 1 capsule by mouth daily.  . Ca Carbonate-Mag Hydroxide (ROLAIDS PO) Take by mouth as needed.  . famotidine (PEPCID) 20 MG tablet TAKE 1 TABLET BY MOUTH TWO  TIMES DAILY (Patient taking differently: Take 20 mg by mouth as needed. )  . fluticasone (FLONASE) 50 MCG/ACT nasal spray Place 2 sprays into both nostrils as needed for  allergies or rhinitis.  Marland Kitchen KLOR-CON M20 20 MEQ tablet Take 2 tablets (40 mEq total) by mouth 3 (three) times daily. (Patient taking differently: Take 40 mEq by mouth 3 (three) times daily. )  . Latanoprost 0.005 % EMUL Apply 1 drop to eye daily. 1 drop in each eye daily  . Melatonin 5 MG CAPS Take 1 capsule by mouth at bedtime.  . Multiple Vitamins-Minerals (MULTIVITAMIN WITH MINERALS) tablet Take 1 tablet by mouth daily.  . pantoprazole (PROTONIX) 40 MG tablet Take 40 mg by mouth as needed.  . rosuvastatin (CRESTOR) 40 MG tablet TAKE 1 TABLET BY MOUTH  DAILY  . tamsulosin  (FLOMAX) 0.4 MG CAPS capsule Take 0.4 mg by mouth daily.  . timolol (TIMOPTIC) 0.5 % ophthalmic solution Place 1 drop into both eyes 2 (two) times daily.  Marland Kitchen VITAMIN D, CHOLECALCIFEROL, PO Take by mouth.  . vitamin E 400 UNIT capsule Take 400 Units by mouth daily.     Allergies:   Ace inhibitors and Codeine   Social History   Socioeconomic History  . Marital status: Single    Spouse name: Not on file  . Number of children: Not on file  . Years of education: Not on file  . Highest education level: Not on file  Occupational History  . Not on file  Social Needs  . Financial resource strain: Not on file  . Food insecurity    Worry: Not on file    Inability: Not on file  . Transportation needs    Medical: Not on file    Non-medical: Not on file  Tobacco Use  . Smoking status: Never Smoker  . Smokeless tobacco: Never Used  Substance and Sexual Activity  . Alcohol use: Yes    Comment: occasional  . Drug use: No  . Sexual activity: Not on file  Lifestyle  . Physical activity    Days per week: Not on file    Minutes per session: Not on file  . Stress: Not on file  Relationships  . Social Herbalist on phone: Not on file    Gets together: Not on file    Attends religious service: Not on file    Active member of club or organization: Not on file    Attends meetings of clubs or organizations: Not on file    Relationship status: Not on file  Other Topics Concern  . Not on file  Social History Narrative  . Not on file     Family History: The patient's family history includes Diabetes (age of onset: 93) in his paternal uncle; Hypertension in his father and mother; Stroke in his father. ROS:   Please see the history of present illness.    All 14 point review of systems negative except as described per history of present illness  EKGs/Labs/Other Studies Reviewed:      Recent Labs: 07/27/2018: ALT 27; BUN 19; Creatinine, Ser 1.45; Hemoglobin 16.1; Platelets 189;  Potassium 4.6; Sodium 141  Recent Lipid Panel    Component Value Date/Time   CHOL 161 07/27/2018 1522   CHOL 181 07/19/2017 1628   TRIG 103 07/27/2018 1522   TRIG 68 07/14/2006 1515   HDL 64 07/27/2018 1522   HDL 69 07/19/2017 1628   CHOLHDL 2.5 07/27/2018 1522   VLDL 21 07/27/2018 1522   LDLCALC 76 07/27/2018 1522   LDLCALC 87 07/19/2017 1628    Physical Exam:    VS:  BP 132/88   Pulse 87  Ht 6' (1.829 m)   Wt 183 lb (83 kg)   SpO2 98%   BMI 24.82 kg/m     Wt Readings from Last 3 Encounters:  05/18/19 183 lb (83 kg)  09/06/18 192 lb 1.9 oz (87.1 kg)  07/27/18 195 lb (88.5 kg)     GEN:  Well nourished, well developed in no acute distress HEENT: Normal NECK: No JVD; No carotid bruits LYMPHATICS: No lymphadenopathy CARDIAC: RRR, no murmurs, no rubs, no gallops RESPIRATORY:  Clear to auscultation without rales, wheezing or rhonchi  ABDOMEN: Soft, non-tender, non-distended MUSCULOSKELETAL:  No edema; No deformity  SKIN: Warm and dry LOWER EXTREMITIES: 1+ swelling NEUROLOGIC:  Alert and oriented x 3 PSYCHIATRIC:  Normal affect   ASSESSMENT:    1. Bilateral carotid artery stenosis   2. HYPERTENSION, BENIGN   3. Hyperlipidemia with target LDL less than 70   4. Swelling of both lower extremities   5. Dyspnea on exertion    PLAN:    In order of problems listed above:  1. Bilateral carotid artery stenosis we will schedule him to have carotid ultrasounds bilaterally 2. Essential hypertension blood pressure appears to be mildly elevated at home today in the office is normal we will continue present management. 3. Swelling of lower extremities I think it is a multifactorial issue I am worried that he still malnourished after weeks of having problem with his GI tract.  Of course I will make sure it is not related to his heart.  Therefore, we will schedule him to have a echocardiogram to assess left ventricular ejection fraction.  He does have baseline kidney dysfunction  with creatinine 1.6 therefore I would not rush towards a diuretic. 4. This point exertion only mild again echocardiogram will be done try to clarify that   Medication Adjustments/Labs and Tests Ordered: Current medicines are reviewed at length with the patient today.  Concerns regarding medicines are outlined above.  No orders of the defined types were placed in this encounter.  Medication changes: No orders of the defined types were placed in this encounter.   Signed, Park Liter, MD, [facility] 05/18/2019 2:56 PM    [place]

## 2019-05-18 NOTE — Patient Instructions (Signed)
Medication Instructions:  Your physician recommends that you continue on your current medications as directed. Please refer to the Current Medication list given to you today.  If you need a refill on your cardiac medications before your next appointment, please call your pharmacy.   Lab work: None  If you have labs (blood work) drawn today and your tests are completely normal, you will receive your results only by: Marland Kitchen MyChart Message (if you have MyChart) OR . A paper copy in the mail If you have any lab test that is abnormal or we need to change your treatment, we will call you to review the results.  Testing/Procedures: Your physician has requested that you have an echocardiogram. Echocardiography is a painless test that uses sound waves to create images of your heart. It provides your doctor with information about the size and shape of your heart and how well your heart's chambers and valves are working. This procedure takes approximately one hour. There are no restrictions for this procedure.  Your physician has requested that you have a carotid duplex. This test is an ultrasound of the carotid arteries in your neck. It looks at blood flow through these arteries that supply the brain with blood. Allow one hour for this exam. There are no restrictions or special instructions.  Follow-Up: At [facility], you and your health needs are our priority.  As part of our continuing mission to provide you with exceptional heart care, we have created designated Provider Care Teams.  These Care Teams include your primary Cardiologist (physician) and Advanced Practice Providers (APPs -  Physician Assistants and Nurse Practitioners) who all work together to provide you with the care you need, when you need it. You will need a follow up appointment in 1 months.     Echocardiogram An echocardiogram is a procedure that uses painless sound waves (ultrasound) to produce an image of the heart. Images from  an echocardiogram can provide important information about:  Signs of coronary artery disease (CAD).  Aneurysm detection. An aneurysm is a weak or damaged part of an artery wall that bulges out from the normal force of blood pumping through the body.  Heart size and shape. Changes in the size or shape of the heart can be associated with certain conditions, including heart failure, aneurysm, and CAD.  Heart muscle function.  Heart valve function.  Signs of a past heart attack.  Fluid buildup around the heart.  Thickening of the heart muscle.  A tumor or infectious growth around the heart valves. Tell a health care provider about:  Any allergies you have.  All medicines you are taking, including vitamins, herbs, eye drops, creams, and over-the-counter medicines.  Any blood disorders you have.  Any surgeries you have had.  Any medical conditions you have.  Whether you are pregnant or may be pregnant. What are the risks? Generally, this is a safe procedure. However, problems may occur, including:  Allergic reaction to dye (contrast) that may be used during the procedure. What happens before the procedure? No specific preparation is needed. You may eat and drink normally. What happens during the procedure?   An IV tube may be inserted into one of your veins.  You may receive contrast through this tube. A contrast is an injection that improves the quality of the pictures from your heart.  A gel will be applied to your chest.  A wand-like tool (transducer) will be moved over your chest. The gel will help to transmit the  sound waves from the transducer.  The sound waves will harmlessly bounce off of your heart to allow the heart images to be captured in real-time motion. The images will be recorded on a computer. The procedure may vary among health care providers and hospitals. What happens after the procedure?  You may return to your normal, everyday life, including diet,  activities, and medicines, unless your health care provider tells you not to do that. Summary  An echocardiogram is a procedure that uses painless sound waves (ultrasound) to produce an image of the heart.  Images from an echocardiogram can provide important information about the size and shape of your heart, heart muscle function, heart valve function, and fluid buildup around your heart.  You do not need to do anything to prepare before this procedure. You may eat and drink normally.  After the echocardiogram is completed, you may return to your normal, everyday life, unless your health care provider tells you not to do that. This information is not intended to replace advice given to you by your health care provider. Make sure you discuss any questions you have with your health care provider. Document Released: 08/07/2000 Document Revised: 12/01/2018 Document Reviewed: 09/12/2016 Elsevier Patient Education  2020 [employer].

## 2019-05-18 NOTE — Addendum Note (Signed)
Addended by: Austin Miles on: 05/18/2019 03:04 PM   Modules accepted: Orders

## 2019-05-23 ENCOUNTER — Other Ambulatory Visit: Payer: Self-pay

## 2019-05-23 ENCOUNTER — Ambulatory Visit (INDEPENDENT_AMBULATORY_CARE_PROVIDER_SITE_OTHER): Payer: Medicare Other

## 2019-05-23 ENCOUNTER — Encounter (HOSPITAL_COMMUNITY): Payer: Medicare Other

## 2019-05-23 DIAGNOSIS — I6523 Occlusion and stenosis of bilateral carotid arteries: Secondary | ICD-10-CM

## 2019-05-23 NOTE — Progress Notes (Signed)
Carotid duplex exam has been performed. Bilateral ICA stenosis, Right greater than left.  Jimmy Yianna Tersigni RDCS, RVT

## 2019-05-25 ENCOUNTER — Ambulatory Visit (HOSPITAL_COMMUNITY): Payer: Medicare Other | Attending: Cardiology

## 2019-05-25 ENCOUNTER — Other Ambulatory Visit: Payer: Self-pay

## 2019-05-25 ENCOUNTER — Encounter (HOSPITAL_COMMUNITY): Payer: Medicare Other

## 2019-05-25 DIAGNOSIS — R0600 Dyspnea, unspecified: Secondary | ICD-10-CM

## 2019-05-25 DIAGNOSIS — E785 Hyperlipidemia, unspecified: Secondary | ICD-10-CM

## 2019-05-25 DIAGNOSIS — M7989 Other specified soft tissue disorders: Secondary | ICD-10-CM

## 2019-05-25 DIAGNOSIS — I10 Essential (primary) hypertension: Secondary | ICD-10-CM

## 2019-05-29 ENCOUNTER — Telehealth: Payer: Self-pay | Admitting: Emergency Medicine

## 2019-05-29 DIAGNOSIS — I6523 Occlusion and stenosis of bilateral carotid arteries: Secondary | ICD-10-CM

## 2019-05-29 NOTE — Telephone Encounter (Signed)
Left message for patient to return call regarding results of carotid ultrasound and echocardiogram.

## 2019-05-29 NOTE — Telephone Encounter (Signed)
Called patient back. Informed him of echo and carotid ultrasound results. Informed him Dr. Agustin Cree recommended he see a vascular surgeon due to carotid stenosis. Patient verbally understood. Referral placed, patient advised to call our office if he doesn't hear from them within 1 week, he verbally understood, no further questions.

## 2019-05-29 NOTE — Addendum Note (Signed)
Addended by: Ashok Norris on: 05/29/2019 01:31 PM   Modules accepted: Orders

## 2019-05-31 ENCOUNTER — Telehealth: Payer: Self-pay | Admitting: Cardiology

## 2019-05-31 NOTE — Telephone Encounter (Signed)
Has a vascular appt in November and wants to know if that's too far out

## 2019-05-31 NOTE — Telephone Encounter (Signed)
Can wait

## 2019-05-31 NOTE — Telephone Encounter (Signed)
Please give me the details as to why this appointment is for

## 2019-06-01 NOTE — Telephone Encounter (Signed)
Patient informed he is okay to wait until November appt.

## 2019-06-19 ENCOUNTER — Other Ambulatory Visit: Payer: Self-pay

## 2019-06-19 MED ORDER — KLOR-CON M20 20 MEQ PO TBCR: 40.0000 meq | EXTENDED_RELEASE_TABLET | Freq: Three times a day (TID) | ORAL | 0 refills | Status: DC

## 2019-06-27 ENCOUNTER — Other Ambulatory Visit: Payer: Self-pay

## 2019-06-27 DIAGNOSIS — I6523 Occlusion and stenosis of bilateral carotid arteries: Secondary | ICD-10-CM

## 2019-06-29 ENCOUNTER — Telehealth: Payer: Self-pay | Admitting: Cardiology

## 2019-06-29 NOTE — Telephone Encounter (Signed)
Is looking in new insurance and wants someone to call him and go over his conditions/dx's with him

## 2019-07-02 NOTE — Telephone Encounter (Signed)
That is strange request, that is wahat we did during the visit, remind me tomorrow , so we can call him

## 2019-07-03 ENCOUNTER — Telehealth (HOSPITAL_COMMUNITY): Payer: Self-pay | Admitting: *Deleted

## 2019-07-03 NOTE — Telephone Encounter (Signed)

## 2019-07-04 ENCOUNTER — Ambulatory Visit (INDEPENDENT_AMBULATORY_CARE_PROVIDER_SITE_OTHER): Payer: Medicare Other | Admitting: Vascular Surgery

## 2019-07-04 ENCOUNTER — Other Ambulatory Visit: Payer: Self-pay

## 2019-07-04 ENCOUNTER — Encounter: Payer: Self-pay | Admitting: Vascular Surgery

## 2019-07-04 ENCOUNTER — Ambulatory Visit (HOSPITAL_COMMUNITY)
Admission: RE | Admit: 2019-07-04 | Discharge: 2019-07-04 | Disposition: A | Discharge status: Home / Self Care | Payer: Medicare Other | Source: Ambulatory Visit | Attending: Vascular Surgery | Admitting: Vascular Surgery

## 2019-07-04 VITALS — BP 142/94 | HR 60 | Temp 97.6°F | Ht 72.0 in | Wt 186.0 lb

## 2019-07-04 DIAGNOSIS — I6523 Occlusion and stenosis of bilateral carotid arteries: Secondary | ICD-10-CM

## 2019-07-04 NOTE — Progress Notes (Signed)
Vascular and Vein Specialist of [facility]  Patient name: Darrell Williams MRN: [id number] DOB: September 16, 1945 Sex: male  REASON FOR CONSULT: Discussed bilateral carotid stenosis  HPI: Darrell Williams is a 73 y.o. male, who is here today for evaluation of bilateral carotid stenosis.  He has a long history of known asymptomatic disease.  He recently underwent repeat duplex and asked for and is here for repeat study and discussion.  His study and aspirin are showed potential progression in his left internal carotid artery to a severe level of stenosis.  He specifically denies any focal neurologic deficits.  He does have chronic dizziness.  Past Medical History:  Diagnosis Date   At risk for sleep apnea    STOP-BANG= 4    SENT TO PCP 07-16-2014   Bilateral carotid artery stenosis    Bilateral ICA  60-79%  per last duplex 02-21-2014   Bilateral ureteral calculi    BPH (benign prostatic hypertrophy)    Coronary atherosclerosis of native coronary artery    cardiologist-  dr bensinhom--  pLAD 25%  per CT scan   Frequency of urination    Glaucoma of both eyes    Hyperlipidemia    Hypertension    Plaque    cholesterol plaque--on his retina exam   Urgency of urination    Wears glasses     Family History  Problem Relation Age of Onset   Hypertension Mother    Stroke Father    Hypertension Father    Diabetes Paternal Uncle 29    SOCIAL HISTORY: Social History   Socioeconomic History   Marital status: Single    Spouse name: Not on file   Number of children: Not on file   Years of education: Not on file   Highest education level: Not on file  Occupational History   Not on file  Social Needs   Financial resource strain: Not on file   Food insecurity    Worry: Not on file    Inability: Not on file   Transportation needs    Medical: Not on file    Non-medical: Not on file  Tobacco Use   Smoking status: Never Smoker    Smokeless tobacco: Never Used  Substance and Sexual Activity   Alcohol use: Yes    Comment: occasional   Drug use: No   Sexual activity: Not on file  Lifestyle   Physical activity    Days per week: Not on file    Minutes per session: Not on file   Stress: Not on file  Relationships   Social connections    Talks on phone: Not on file    Gets together: Not on file    Attends religious service: Not on file    Active member of club or organization: Not on file    Attends meetings of clubs or organizations: Not on file    Relationship status: Not on file   Intimate partner violence    Fear of current or ex partner: Not on file    Emotionally abused: Not on file    Physically abused: Not on file    Forced sexual activity: Not on file  Other Topics Concern   Not on file  Social History Narrative   Not on file    Allergies  Allergen Reactions   Ace Inhibitors Other (See Comments)    : cough   Codeine Nausea And Vomiting    Current Outpatient Medications  Medication Sig Dispense Refill  acetaminophen (TYLENOL) 500 MG tablet Take 2 tablets by mouth daily as needed.     amLODipine (NORVASC) 10 MG tablet TAKE ONE-HALF TABLET BY  MOUTH TWICE A DAY (Patient taking differently: Take 5 mg by mouth daily. TAKE ONE-HALF TABLET BY  MOUTH TWICE A DAY) 90 tablet 3   aspirin EC 81 MG tablet Take 81 mg by mouth daily.     b complex vitamins capsule Take 1 capsule by mouth daily.     Ca Carbonate-Mag Hydroxide (ROLAIDS PO) Take by mouth as needed.     famotidine (PEPCID) 20 MG tablet TAKE 1 TABLET BY MOUTH TWO  TIMES DAILY (Patient taking differently: Take 20 mg by mouth as needed. ) 90 tablet 3   fluticasone (FLONASE) 50 MCG/ACT nasal spray Place 2 sprays into both nostrils as needed for allergies or rhinitis.     gabapentin (NEURONTIN) 100 MG capsule Take by mouth.     KLOR-CON M20 20 MEQ tablet Take 2 tablets (40 mEq total) by mouth 3 (three) times daily. (Patient  taking differently: Take 60 mEq by mouth daily. ) 270 tablet 0   Latanoprost 0.005 % EMUL Apply 1 drop to eye daily. 1 drop in each eye daily     loratadine (CLARITIN) 10 MG tablet Take 10 mg by mouth daily as needed for allergies.     Melatonin 5 MG CAPS Take 1 capsule by mouth at bedtime.     Multiple Vitamins-Minerals (MULTIVITAMIN WITH MINERALS) tablet Take 1 tablet by mouth daily.     pantoprazole (PROTONIX) 40 MG tablet Take 40 mg by mouth as needed.     rosuvastatin (CRESTOR) 40 MG tablet TAKE 1 TABLET BY MOUTH  DAILY 90 tablet 3   tamsulosin (FLOMAX) 0.4 MG CAPS capsule Take 0.4 mg by mouth daily.     timolol (TIMOPTIC) 0.5 % ophthalmic solution Place 1 drop into both eyes 2 (two) times daily.     VITAMIN D, CHOLECALCIFEROL, PO Take by mouth.     vitamin E 400 UNIT capsule Take 400 Units by mouth daily.     No current facility-administered medications for this visit.     REVIEW OF SYSTEMS:  [X]  denotes positive finding, [ ]  denotes negative finding Cardiac  Comments:  Chest pain or chest pressure:    Shortness of breath upon exertion:    Short of breath when lying flat:    Irregular heart rhythm:        Vascular    Pain in calf, thigh, or hip brought on by ambulation:    Pain in feet at night that wakes you up from your sleep:     Blood clot in your veins:    Leg swelling:         Pulmonary    Oxygen at home:    Productive cough:     Wheezing:         Neurologic    Sudden weakness in arms or legs:     Sudden numbness in arms or legs:     Sudden onset of difficulty speaking or slurred speech:    Temporary loss of vision in one eye:     Problems with dizziness:  x       Gastrointestinal    Blood in stool:     Vomited blood:         Genitourinary    Burning when urinating:     Blood in urine:        Psychiatric  Major depression:         Hematologic    Bleeding problems:    Problems with blood clotting too easily:        Skin    Rashes or  ulcers:        Constitutional    Fever or chills:      PHYSICAL EXAM: Vitals:   07/04/19 1402 07/04/19 1405  BP: (!) 138/93 (!) 142/94  Pulse: 60   Temp: 97.6 F (36.4 C)   Weight: 186 lb (84.4 kg)   Height: 6' (1.829 m)     GENERAL: The patient is a well-nourished male, in no acute distress. The vital signs are documented above. CARDIOVASCULAR: Carotid arteries without bruits bilaterally.  2+ radial and 2+ popliteal pulses bilaterally.  He does have pitting edema at his ankles bilaterally and is more difficult to feel his pedal pulses.  Feet are well-perfused PULMONARY: There is good air exchange  ABDOMEN: Soft and non-tender  MUSCULOSKELETAL: There are no major deformities or cyanosis. NEUROLOGIC: No focal weakness or paresthesias are detected. SKIN: There are no ulcers or rashes noted. PSYCHIATRIC: The patient has a normal affect.  DATA:  Carotid duplex from 05/25/2019 and aspirin suggested right carotid stenosis in the 40 to 59% range and left in the 80 to 99% range.  Today duplex in our office suggest lesser degree on both sides with a 1-39 on the right and 40-59 on the left.  In reviewing the study from [place], the end-diastolic velocities on the left were 79 cm/s.  In our lab this would have corresponded with a 60 to 79% predicted stenosis.  MEDICAL ISSUES: Patient is clearly asymptomatic from his carotid disease.  I explained there was some discrepancy in interpretation between the study as per here.  I have recommended that we see him again in 6 months for repeat carotid duplex.  If this shows a lower degree of stenosis we found today, would recommend yearly duplex.  He knows to present immediately to the emergency room should he have any neurologic focal deficits   Rosetta Posner, MD [facility] Vascular and Vein Specialists of [facility] Tel [phone] Pager [phone]

## 2019-07-04 NOTE — Telephone Encounter (Signed)
Patient contact by Dr. Agustin Cree. Will discuss further at his upcoming office appointment.

## 2019-07-05 ENCOUNTER — Encounter: Payer: Self-pay | Admitting: Cardiology

## 2019-07-05 ENCOUNTER — Ambulatory Visit (INDEPENDENT_AMBULATORY_CARE_PROVIDER_SITE_OTHER): Payer: Medicare Other | Admitting: Cardiology

## 2019-07-05 VITALS — BP 130/80 | HR 76 | Ht 72.0 in | Wt 186.0 lb

## 2019-07-05 DIAGNOSIS — M7989 Other specified soft tissue disorders: Secondary | ICD-10-CM

## 2019-07-05 DIAGNOSIS — R0600 Dyspnea, unspecified: Secondary | ICD-10-CM

## 2019-07-05 DIAGNOSIS — I10 Essential (primary) hypertension: Secondary | ICD-10-CM | POA: Diagnosis not present

## 2019-07-05 DIAGNOSIS — I2510 Atherosclerotic heart disease of native coronary artery without angina pectoris: Secondary | ICD-10-CM | POA: Diagnosis not present

## 2019-07-05 DIAGNOSIS — E785 Hyperlipidemia, unspecified: Secondary | ICD-10-CM

## 2019-07-05 DIAGNOSIS — I6523 Occlusion and stenosis of bilateral carotid arteries: Secondary | ICD-10-CM

## 2019-07-05 DIAGNOSIS — R0609 Other forms of dyspnea: Secondary | ICD-10-CM

## 2019-07-05 NOTE — Patient Instructions (Signed)

## 2019-07-05 NOTE — Progress Notes (Signed)
.r  Cardiology Office Note:    Date:  07/05/2019   ID:  Darrell Williams, DOB [birth year], MRN [id number]  PCP:  Algis Greenhouse, MD  Cardiologist:  Jenne Campus, MD    Referring MD: Algis Greenhouse, MD   Chief Complaint  Patient presents with  . 1 month follow up  Doing better  History of Present Illness:    Darrell Williams is a 73 y.o. male with past medical history significant for peripheral vascular disease, cardiac arterial disease, coronary disease with CT angios showing 25% proximal LAD in 2011.  Recently he ended up having some abdominal catastrophe.  He ended being in [facility] after that he was transferred to [facility] he lost about 30 pounds but now recovering and doing much better I see him last time because of swelling of lower extremities.  Suspicion was for potentially having cardiomyopathy.  Luckily his echocardiogram showed normal/preserved left ventricle ejection fraction.  Dose of amlodipine has been lowered and he seems to be doing better with a long discussion about a lot of issues today first of all recently he was find to have increased velocity in his left internal carotic artery.  He was referred to vascular surgeon this was repeated and apparently velocity were not as high as reported in our office.  Therefore he will have every 6 months cardiac ultrasound.  Overall he is doing well denies having any chest pain, tightness, pressure, burning in the chest.  He does what he wants to do.  Past Medical History:  Diagnosis Date  . At risk for sleep apnea    STOP-BANG= 4    SENT TO PCP 07-16-2014  . Bilateral carotid artery stenosis    Bilateral ICA  60-79%  per last duplex 02-21-2014  . Bilateral ureteral calculi   . BPH (benign prostatic hypertrophy)   . Coronary atherosclerosis of native coronary artery    cardiologist-  dr bensinhom--  pLAD 25%  per CT scan  . Frequency of urination   . Glaucoma of both eyes   . Hyperlipidemia   . Hypertension    . Plaque    cholesterol plaque--on his retina exam  . Urgency of urination   . Wears glasses     Past Surgical History:  Procedure Laterality Date  . CARDIOVASCULAR STRESS TEST  08-12-2005  dr Ron Parker   normal nuclear study/  no ischemia /  ef 67%---  11/ 2009  normal ETT  . CATARACT EXTRACTION W/ INTRAOCULAR LENS  IMPLANT, BILATERAL    . COLONOSCOPY  2005 (approx)  . CYSTOSCOPY WITH RETROGRADE PYELOGRAM, URETEROSCOPY AND STENT PLACEMENT Bilateral 07/23/2014   Procedure: CYSTOSCOPY WITH BILATERAL RETROGRADE PYELOGRAM, BILATERAL URETEROSCOPY AND STENT PLACEMENT, STONE EXTRACTION;  Surgeon: Bernestine Amass, MD;  Location: [facility];  Service: Urology;  Laterality: Bilateral;  . HOLMIUM LASER APPLICATION Bilateral [id number]   Procedure: HOLMIUM LASER APPLICATION;  Surgeon: Bernestine Amass, MD;  Location: [facility];  Service: Urology;  Laterality: Bilateral;  . INGUINAL HERNIA REPAIR Bilateral 1995  (approx)    Current Medications: Current Meds  Medication Sig  . acetaminophen (TYLENOL) 500 MG tablet Take 2 tablets by mouth daily as needed.  Marland Kitchen amLODipine (NORVASC) 10 MG tablet TAKE ONE-HALF TABLET BY  MOUTH TWICE A DAY (Patient taking differently: Take 5 mg by mouth daily. TAKE ONE-HALF TABLET BY  MOUTH TWICE A DAY)  . aspirin EC 81 MG tablet Take 81 mg by mouth daily.  Marland Kitchen  b complex vitamins capsule Take 1 capsule by mouth daily.  . Ca Carbonate-Mag Hydroxide (ROLAIDS PO) Take by mouth as needed.  . famotidine (PEPCID) 20 MG tablet TAKE 1 TABLET BY MOUTH TWO  TIMES DAILY (Patient taking differently: Take 20 mg by mouth as needed. )  . fluticasone (FLONASE) 50 MCG/ACT nasal spray Place 2 sprays into both nostrils as needed for allergies or rhinitis.  Marland Kitchen gabapentin (NEURONTIN) 100 MG capsule Take by mouth.  Marland Kitchen KLOR-CON M20 20 MEQ tablet Take 2 tablets (40 mEq total) by mouth 3 (three) times daily. (Patient taking differently: Take 60 mEq by mouth daily. )  .  Latanoprost 0.005 % EMUL Apply 1 drop to eye daily. 1 drop in each eye daily  . loratadine (CLARITIN) 10 MG tablet Take 10 mg by mouth daily as needed for allergies.  . Melatonin 5 MG CAPS Take 1 capsule by mouth at bedtime.  . Multiple Vitamins-Minerals (MULTIVITAMIN WITH MINERALS) tablet Take 1 tablet by mouth daily.  . pantoprazole (PROTONIX) 40 MG tablet Take 40 mg by mouth as needed.  . rosuvastatin (CRESTOR) 40 MG tablet TAKE 1 TABLET BY MOUTH  DAILY  . tamsulosin (FLOMAX) 0.4 MG CAPS capsule Take 0.4 mg by mouth daily.  . timolol (TIMOPTIC) 0.5 % ophthalmic solution Place 1 drop into both eyes 2 (two) times daily.  Marland Kitchen VITAMIN D, CHOLECALCIFEROL, PO Take by mouth.  . vitamin E 400 UNIT capsule Take 400 Units by mouth daily.     Allergies:   Ace inhibitors and Codeine   Social History   Socioeconomic History  . Marital status: Single    Spouse name: Not on file  . Number of children: Not on file  . Years of education: Not on file  . Highest education level: Not on file  Occupational History  . Not on file  Social Needs  . Financial resource strain: Not on file  . Food insecurity    Worry: Not on file    Inability: Not on file  . Transportation needs    Medical: Not on file    Non-medical: Not on file  Tobacco Use  . Smoking status: Never Smoker  . Smokeless tobacco: Never Used  Substance and Sexual Activity  . Alcohol use: Yes    Comment: occasional  . Drug use: No  . Sexual activity: Not on file  Lifestyle  . Physical activity    Days per week: Not on file    Minutes per session: Not on file  . Stress: Not on file  Relationships  . Social Herbalist on phone: Not on file    Gets together: Not on file    Attends religious service: Not on file    Active member of club or organization: Not on file    Attends meetings of clubs or organizations: Not on file    Relationship status: Not on file  Other Topics Concern  . Not on file  Social History  Narrative  . Not on file     Family History: The patient's family history includes Diabetes (age of onset: 30) in his paternal uncle; Hypertension in his father and mother; Stroke in his father. ROS:   Please see the history of present illness.    All 14 point review of systems negative except as described per history of present illness  EKGs/Labs/Other Studies Reviewed:      Recent Labs: 07/27/2018: ALT 27; BUN 19; Creatinine, Ser 1.45; Hemoglobin 16.1; Platelets 189;  Potassium 4.6; Sodium 141  Recent Lipid Panel    Component Value Date/Time   CHOL 161 07/27/2018 1522   CHOL 181 07/19/2017 1628   TRIG 103 07/27/2018 1522   TRIG 68 07/14/2006 1515   HDL 64 07/27/2018 1522   HDL 69 07/19/2017 1628   CHOLHDL 2.5 07/27/2018 1522   VLDL 21 07/27/2018 1522   LDLCALC 76 07/27/2018 1522   LDLCALC 87 07/19/2017 1628    Physical Exam:    VS:  BP 130/80   Pulse 76   Ht 6' (1.829 m)   Wt 186 lb (84.4 kg)   SpO2 96%   BMI 25.23 kg/m     Wt Readings from Last 3 Encounters:  07/05/19 186 lb (84.4 kg)  07/04/19 186 lb (84.4 kg)  05/18/19 183 lb (83 kg)     GEN:  Well nourished, well developed in no acute distress HEENT: Normal NECK: No JVD; No carotid bruits LYMPHATICS: No lymphadenopathy CARDIAC: RRR, no murmurs, no rubs, no gallops RESPIRATORY:  Clear to auscultation without rales, wheezing or rhonchi  ABDOMEN: Soft, non-tender, non-distended MUSCULOSKELETAL:  No edema; No deformity  SKIN: Warm and dry LOWER EXTREMITIES: no swelling NEUROLOGIC:  Alert and oriented x 3 PSYCHIATRIC:  Normal affect   ASSESSMENT:    1. Swelling of both lower extremities   2. Bilateral carotid artery stenosis   3. Atherosclerosis of native coronary artery of native heart without angina pectoris   4. HYPERTENSION, BENIGN   5. Dyspnea on exertion   6. Hyperlipidemia with target LDL less than 70    PLAN:    In order of problems listed above:  1. Swelling of both lower extremities  improved after lowering of amlodipine.  We will continue monitoring.  Echocardiogram showed preserved left ventricle ejection fraction 2. 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  He will required every 6 months carotic ultrasound. 3. Hypertension seems to be well controlled continue present management 4. Dyslipidemia we will check his fasting lipid profile in about 2 months. 5. Dyspnea on exertion doing well from that point review   Overall it was very long visit patient asked a lot of questions all were answered to his satisfaction's.  We will see him back in 3 months   Medication Adjustments/Labs and Tests Ordered: Current medicines are reviewed at length with the patient today.  Concerns regarding medicines are outlined above.  No orders of the defined types were placed in this encounter.  Medication changes: No orders of the defined types were placed in this encounter.   Signed, Park Liter, MD, [facility] 07/05/2019 6:35 PM    [place]

## 2019-07-07 ENCOUNTER — Other Ambulatory Visit: Payer: Self-pay | Admitting: Cardiology

## 2019-07-07 NOTE — Telephone Encounter (Signed)
Rx refill sent to pharmacy. 

## 2019-07-11 ENCOUNTER — Other Ambulatory Visit: Payer: Self-pay

## 2019-07-11 DIAGNOSIS — I6523 Occlusion and stenosis of bilateral carotid arteries: Secondary | ICD-10-CM

## 2019-07-25 ENCOUNTER — Telehealth: Payer: Self-pay | Admitting: Cardiology

## 2019-07-25 NOTE — Telephone Encounter (Signed)
Patient called wanting to know if paperwork he dropped off to this office last ready has been reviewed by Dr Raliegh Ip and is ready for pickup.  Please call patient to discuss

## 2019-07-25 NOTE — Telephone Encounter (Signed)
Called patient. He reports he dropped paperwork off last week for Dr. Agustin Cree to review. He will take another copy by [facility] office and it will be faxed here. Will call patient with recommendations Patient verbally understood. No further questions.

## 2019-07-27 NOTE — Telephone Encounter (Signed)
Paperwork faxed to [employer]. Patient will pick up there today.

## 2019-08-23 ENCOUNTER — Other Ambulatory Visit: Payer: Self-pay

## 2019-08-23 MED ORDER — KLOR-CON M20 20 MEQ PO TBCR: 20.0000 meq | EXTENDED_RELEASE_TABLET | Freq: Three times a day (TID) | ORAL | 1 refills | Status: DC

## 2019-08-23 NOTE — Telephone Encounter (Signed)
Resent RX for Klor-Con M 20 ER TAB 20 MEQ TID.  Pharmacy needed clarification on dose.

## 2019-08-30 ENCOUNTER — Other Ambulatory Visit: Payer: Self-pay

## 2019-08-30 MED ORDER — KLOR-CON M20 20 MEQ PO TBCR: EXTENDED_RELEASE_TABLET | ORAL | 3 refills | Status: DC

## 2019-09-01 ENCOUNTER — Other Ambulatory Visit: Payer: Self-pay

## 2019-09-01 MED ORDER — POTASSIUM CHLORIDE CRYS ER 20 MEQ PO TBCR: EXTENDED_RELEASE_TABLET | ORAL | 3 refills | Status: DC

## 2019-09-20 ENCOUNTER — Telehealth: Payer: Self-pay

## 2019-09-20 NOTE — Telephone Encounter (Signed)
Received paper refill request for Potassium Chloride from [facility]. They needed clarification on the directions.   Called patient to confirm how he is taking the medication. Patient said that he takes 40 MG in the AM and 20 MG in the evening. So 3 Tablets Daily.   Confirmed with Dr. Raliegh Ip that this was acceptable, he signed off on request. I faxed request to [place] confirming instructions.

## 2019-10-06 ENCOUNTER — Ambulatory Visit: Payer: Medicare Other | Admitting: Cardiology

## 2019-10-09 ENCOUNTER — Telehealth: Payer: Self-pay | Admitting: Cardiology

## 2019-10-09 NOTE — Telephone Encounter (Signed)
Spoke with patient. Patient was around someone not wearing a mask, He didn't know them and doesn't know if that person had covid or not so he went for testing. Test came back negative. Patient okayed to come to office appointment on 10/10/2019.

## 2019-10-09 NOTE — Telephone Encounter (Signed)
New Message   Pt wanted to let Dr. Agustin Cree that he was tested negative from his covid test last Friday. He wanted to receive a call from the nurse to give reassurance that it's ok for him to go to his appointment tomorrow 02/16.  Please call

## 2019-10-10 ENCOUNTER — Encounter: Payer: Self-pay | Admitting: Cardiology

## 2019-10-10 ENCOUNTER — Ambulatory Visit (INDEPENDENT_AMBULATORY_CARE_PROVIDER_SITE_OTHER): Payer: Medicare Other | Admitting: Cardiology

## 2019-10-10 ENCOUNTER — Other Ambulatory Visit: Payer: Self-pay

## 2019-10-10 VITALS — BP 118/80 | HR 63 | Ht 72.0 in | Wt 181.6 lb

## 2019-10-10 DIAGNOSIS — E785 Hyperlipidemia, unspecified: Secondary | ICD-10-CM

## 2019-10-10 DIAGNOSIS — R072 Precordial pain: Secondary | ICD-10-CM | POA: Diagnosis not present

## 2019-10-10 DIAGNOSIS — I10 Essential (primary) hypertension: Secondary | ICD-10-CM | POA: Diagnosis not present

## 2019-10-10 DIAGNOSIS — I6523 Occlusion and stenosis of bilateral carotid arteries: Secondary | ICD-10-CM

## 2019-10-10 NOTE — Progress Notes (Signed)
, °

## 2019-10-10 NOTE — Patient Instructions (Signed)
Medication Instructions:  Your physician recommends that you continue on your current medications as directed. Please refer to the Current Medication list given to you today.  *If you need a refill on your cardiac medications before your next appointment, please call your pharmacy*  Lab Work: Your physician recommends that you return for lab work today: lipids    If you have labs (blood work) drawn today and your tests are completely normal, you will receive your results only by: Marland Kitchen MyChart Message (if you have MyChart) OR . A paper copy in the mail If you have any lab test that is abnormal or we need to change your treatment, we will call you to review the results.  Testing/Procedures: None.   Follow-Up: At [facility], you and your health needs are our priority.  As part of our continuing mission to provide you with exceptional heart care, we have created designated Provider Care Teams.  These Care Teams include your primary Cardiologist (physician) and Advanced Practice Providers (APPs -  Physician Assistants and Nurse Practitioners) who all work together to provide you with the care you need, when you need it.  Your next appointment:   5 month(s)  The format for your next appointment:   In Person  Provider:   Jenne Campus, MD  Other Instructions

## 2019-10-10 NOTE — Progress Notes (Signed)
Cardiology Office Note:    Date:  10/10/2019   ID:  Darrell Williams, DOB 1946-07-13, MRN [id number]  PCP:  Algis Greenhouse, MD  Cardiologist:  Jenne Campus, MD    Referring MD: Algis Greenhouse, MD   No chief complaint on file. Doing very well  History of Present Illness:    Darrell Williams is a 74 y.o. male  with past medical history significant for peripheral vascular disease, cardiac arterial disease, coronary disease with CT angios showing 25% proximal LAD in 2011.  Recently he ended up having some abdominal catastrophe.  He ended being in [facility] hospital after that he was transferred to [facility] he lost about 30 pounds but now recovering and doing much better I see him last time because of swelling of lower extremities.  Suspicion was for potentially having cardiomyopathy.  Luckily his echocardiogram showed normal/preserved left ventricle ejection fraction.  Dose of amlodipine has been lowered and he seems to be doing better with a long discussion about a lot of issues today first of all recently he was find to have increased velocity in his left internal carotic artery.  He was referred to vascular surgeon this was repeated and apparently velocity were not as high as reported in our office.  Therefore he will have every 6 months carotic ultrasound.   Comes today to my office for follow-up will doing great.  Denies have any chest pain, tightness, squeezing, pressure burning chest.  He is back to himself.  He is active and he absolutely no symptoms.  Past Medical History:  Diagnosis Date  . At risk for sleep apnea    STOP-BANG= 4    SENT TO PCP 07-16-2014  . Bilateral carotid artery stenosis    Bilateral ICA  60-79%  per last duplex 02-21-2014  . Bilateral ureteral calculi   . BPH (benign prostatic hypertrophy)   . Coronary atherosclerosis of native coronary artery    cardiologist-  dr bensinhom--  pLAD 25%  per CT scan  . Frequency of urination   . Glaucoma of both  eyes   . Hyperlipidemia   . Hypertension   . Plaque    cholesterol plaque--on his retina exam  . Urgency of urination   . Wears glasses     Past Surgical History:  Procedure Laterality Date  . CARDIOVASCULAR STRESS TEST  08-12-2005  dr Ron Parker   normal nuclear study/  no ischemia /  ef 67%---  11/ 2009  normal ETT  . CATARACT EXTRACTION W/ INTRAOCULAR LENS  IMPLANT, BILATERAL    . COLONOSCOPY  2005 (approx)  . CYSTOSCOPY WITH RETROGRADE PYELOGRAM, URETEROSCOPY AND STENT PLACEMENT Bilateral 07/23/2014   Procedure: CYSTOSCOPY WITH BILATERAL RETROGRADE PYELOGRAM, BILATERAL URETEROSCOPY AND STENT PLACEMENT, STONE EXTRACTION;  Surgeon: Bernestine Amass, MD;  Location[facility];  Service: Urology;  Laterality: Bilateral;  . HOLMIUM LASER APPLICATION Bilateral [id number]   Procedure: HOLMIUM LASER APPLICATION;  Surgeon: Bernestine Amass, MD;  Location: [facility];  Service: Urology;  Laterality: Bilateral;  . INGUINAL HERNIA REPAIR Bilateral 1995  (approx)    Current Medications: Current Meds  Medication Sig  . acetaminophen (TYLENOL) 500 MG tablet Take 2 tablets by mouth daily as needed.  Marland Kitchen amLODipine (NORVASC) 10 MG tablet TAKE ONE-HALF TABLET BY  MOUTH TWICE A DAY (Patient taking differently: Take 5 mg by mouth daily. TAKE ONE-HALF TABLET BY  MOUTH TWICE A DAY)  . aspirin EC 81 MG tablet Take 81  mg by mouth daily.  Marland Kitchen b complex vitamins capsule Take 1 capsule by mouth daily.  . Ca Carbonate-Mag Hydroxide (ROLAIDS PO) Take by mouth as needed.  . famotidine (PEPCID) 20 MG tablet TAKE 1 TABLET BY MOUTH TWO  TIMES DAILY (Patient taking differently: Take 20 mg by mouth as needed. )  . fluticasone (FLONASE) 50 MCG/ACT nasal spray Place 2 sprays into both nostrils as needed for allergies or rhinitis.  Marland Kitchen gabapentin (NEURONTIN) 100 MG capsule Take by mouth.  . Latanoprost 0.005 % EMUL Apply 1 drop to eye daily. 1 drop in each eye daily  . loratadine (CLARITIN) 10 MG  tablet Take 10 mg by mouth daily as needed for allergies.  . Melatonin 5 MG CAPS Take 1 capsule by mouth at bedtime.  . Multiple Vitamins-Minerals (MULTIVITAMIN WITH MINERALS) tablet Take 1 tablet by mouth daily.  . pantoprazole (PROTONIX) 40 MG tablet Take 40 mg by mouth as needed.  . potassium chloride SA (KLOR-CON M20) 20 MEQ tablet 40mg  in the morning 20mg  at night  . rosuvastatin (CRESTOR) 40 MG tablet TAKE 1 TABLET BY MOUTH  DAILY  . tamsulosin (FLOMAX) 0.4 MG CAPS capsule Take 0.4 mg by mouth daily.  . timolol (TIMOPTIC) 0.5 % ophthalmic solution Place 1 drop into both eyes 2 (two) times daily.  Marland Kitchen VITAMIN D, CHOLECALCIFEROL, PO Take by mouth.  . vitamin E 400 UNIT capsule Take 400 Units by mouth daily.     Allergies:   Ace inhibitors and Codeine   Social History   Socioeconomic History  . Marital status: Single    Spouse name: Not on file  . Number of children: Not on file  . Years of education: Not on file  . Highest education level: Not on file  Occupational History  . Not on file  Tobacco Use  . Smoking status: Never Smoker  . Smokeless tobacco: Never Used  Substance and Sexual Activity  . Alcohol use: Yes    Comment: occasional  . Drug use: No  . Sexual activity: Not on file  Other Topics Concern  . Not on file  Social History Narrative  . Not on file   Social Determinants of Health   Financial Resource Strain:   . Difficulty of Paying Living Expenses: Not on file  Food Insecurity:   . Worried About Charity fundraiser in the Last Year: Not on file  . Ran Out of Food in the Last Year: Not on file  Transportation Needs:   . Lack of Transportation (Medical): Not on file  . Lack of Transportation (Non-Medical): Not on file  Physical Activity:   . Days of Exercise per Week: Not on file  . Minutes of Exercise per Session: Not on file  Stress:   . Feeling of Stress : Not on file  Social Connections:   . Frequency of Communication with Friends and Family: Not  on file  . Frequency of Social Gatherings with Friends and Family: Not on file  . Attends Religious Services: Not on file  . Active Member of Clubs or Organizations: Not on file  . Attends Archivist Meetings: Not on file  . Marital Status: Not on file     Family History: The patient's family history includes Diabetes (age of onset: 4) in his paternal uncle; Hypertension in his father and mother; Stroke in his father. ROS:   Please see the history of present illness.    All 14 point review of systems negative except  as described per history of present illness  EKGs/Labs/Other Studies Reviewed:      Recent Labs: No results found for requested labs within last 8760 hours.  Recent Lipid Panel    Component Value Date/Time   CHOL 161 07/27/2018 1522   CHOL 181 07/19/2017 1628   TRIG 103 07/27/2018 1522   TRIG 68 07/14/2006 1515   HDL 64 07/27/2018 1522   HDL 69 07/19/2017 1628   CHOLHDL 2.5 07/27/2018 1522   VLDL 21 07/27/2018 1522   LDLCALC 76 07/27/2018 1522   LDLCALC 87 07/19/2017 1628    Physical Exam:    VS:  BP 118/80   Pulse 63   Ht 6' (1.829 m)   Wt 181 lb 9.6 oz (82.4 kg)   SpO2 94%   BMI 24.63 kg/m     Wt Readings from Last 3 Encounters:  10/10/19 181 lb 9.6 oz (82.4 kg)  07/05/19 186 lb (84.4 kg)  07/04/19 186 lb (84.4 kg)     GEN:  Well nourished, well developed in no acute distress HEENT: Normal NECK: No JVD; No carotid bruits LYMPHATICS: No lymphadenopathy CARDIAC: RRR, no murmurs, no rubs, no gallops RESPIRATORY:  Clear to auscultation without rales, wheezing or rhonchi  ABDOMEN: Soft, non-tender, non-distended MUSCULOSKELETAL:  No edema; No deformity  SKIN: Warm and dry LOWER EXTREMITIES: no swelling NEUROLOGIC:  Alert and oriented x 3 PSYCHIATRIC:  Normal affect   ASSESSMENT:    1. Hyperlipidemia with target LDL less than 70   2. HYPERTENSION, BENIGN   3. CHEST PAIN, PRECORDIAL   4. Bilateral carotid artery stenosis     PLAN:    In order of problems listed above:   1.  Hyperlipidemia: We will schedule him to have fasting lipid profile done. 2.  Essential hypertension blood pressure well controlled continue present management. 3.  Chest pain denies having any.  Doing well from that point review. 4.  Peripheral vascular disease in form of bilateral carotic arterial stenosis.  He is followed by vascular group in [place].   Medication Adjustments/Labs and Tests Ordered: Current medicines are reviewed at length with the patient today.  Concerns regarding medicines are outlined above.  Orders Placed This Encounter  Procedures  . Lipid panel  . EKG 12-Lead   Medication changes: No orders of the defined types were placed in this encounter.   Signed, Park Liter, MD, [facility] 10/10/2019 4:40 PM    [facility]

## 2019-10-12 LAB — LIPID PANEL
Chol/HDL Ratio: 2.5 ratio (ref 0.0–5.0)
Cholesterol, Total: 169 mg/dL (ref 100–199)
HDL: 67 mg/dL (ref >39)
LDL Chol Calc (NIH): 88 mg/dL (ref 0–99)
Triglycerides: 73 mg/dL (ref 0–149)
VLDL Cholesterol Cal: 14 mg/dL (ref 5–40)

## 2019-11-23 ENCOUNTER — Telehealth: Payer: Self-pay | Admitting: Emergency Medicine

## 2019-11-23 NOTE — Telephone Encounter (Signed)
Called patient about a paper he brought here for questions for Dr. Agustin Cree regarding statins, and lipid lowering therapies. I advised him Dr. Agustin Cree said there wasn't much data on what he was asking about but he would discuss further with him at his next appointment. Patient verbally understood no further questions.

## 2019-12-01 DIAGNOSIS — L0293 Carbuncle, unspecified: Secondary | ICD-10-CM

## 2019-12-01 HISTORY — DX: Carbuncle, unspecified: L02.93

## 2020-01-17 ENCOUNTER — Other Ambulatory Visit: Payer: Self-pay

## 2020-01-17 MED ORDER — ROSUVASTATIN CALCIUM 40 MG PO TABS: 40.0000 mg | ORAL_TABLET | Freq: Every day | ORAL | 3 refills | Status: DC

## 2020-02-13 DIAGNOSIS — L723 Sebaceous cyst: Secondary | ICD-10-CM

## 2020-02-13 HISTORY — DX: Sebaceous cyst: L72.3

## 2020-03-08 ENCOUNTER — Other Ambulatory Visit: Payer: Self-pay

## 2020-03-08 DIAGNOSIS — I6523 Occlusion and stenosis of bilateral carotid arteries: Secondary | ICD-10-CM

## 2020-03-08 NOTE — Progress Notes (Signed)
Opened in Error.

## 2020-03-19 ENCOUNTER — Encounter: Payer: Self-pay | Admitting: Vascular Surgery

## 2020-03-19 ENCOUNTER — Ambulatory Visit (INDEPENDENT_AMBULATORY_CARE_PROVIDER_SITE_OTHER): Payer: Medicare Other | Admitting: Vascular Surgery

## 2020-03-19 ENCOUNTER — Ambulatory Visit (HOSPITAL_COMMUNITY)
Admission: RE | Admit: 2020-03-19 | Discharge: 2020-03-19 | Disposition: A | Discharge status: Home / Self Care | Payer: Medicare Other | Source: Ambulatory Visit | Attending: Vascular Surgery | Admitting: Vascular Surgery

## 2020-03-19 ENCOUNTER — Other Ambulatory Visit: Payer: Self-pay

## 2020-03-19 VITALS — BP 160/96 | HR 55 | Temp 97.7°F | Resp 18 | Ht 71.0 in | Wt 181.6 lb

## 2020-03-19 DIAGNOSIS — I6523 Occlusion and stenosis of bilateral carotid arteries: Secondary | ICD-10-CM | POA: Diagnosis not present

## 2020-03-19 NOTE — Progress Notes (Signed)
Vascular and Vein Specialist of [facility]  Patient name: Darrell Williams MRN: [id number] DOB: [birth year] Sex: male  REASON FOR VISIT: Follow-up known carotid disease  HPI: Darrell Williams is a 74 y.o. male today for follow-up.  I had an office with him approximately 8 months ago.  At that time he had a ultrasound from [facility] suggesting critical left carotid stenosis.  Repeat duplex in our office suggested left lower level of stenosis.  I recommended that we see him back for duplex follow-up.  He has had been a great deal of difficulty regarding hiatal hernia and surgery related to this.  He specifically denies any focal neurologic deficits.  He does have orthostatic hypotension and is having some adjustment of his blood pressure medications which seem to be assisting.  Past Medical History:  Diagnosis Date  . At risk for sleep apnea    STOP-BANG= 4    SENT TO PCP 07-16-2014  . Bilateral carotid artery stenosis    Bilateral ICA  60-79%  per last duplex 02-21-2014  . Bilateral ureteral calculi   . BPH (benign prostatic hypertrophy)   . Coronary atherosclerosis of native coronary artery    cardiologist-  dr bensinhom--  pLAD 25%  per CT scan  . Frequency of urination   . Glaucoma of both eyes   . Hyperlipidemia   . Hypertension   . Plaque    cholesterol plaque--on his retina exam  . Urgency of urination   . Wears glasses     Family History  Problem Relation Age of Onset  . Hypertension Mother   . Stroke Father   . Hypertension Father   . Diabetes Paternal Uncle 31    SOCIAL HISTORY: Social History   Tobacco Use  . Smoking status: Never Smoker  . Smokeless tobacco: Never Used  Substance Use Topics  . Alcohol use: Yes    Comment: occasional    Allergies  Allergen Reactions  . Ace Inhibitors Other (See Comments)    : cough  . Codeine Nausea And Vomiting    Current Outpatient Medications  Medication Sig Dispense  Refill  . acetaminophen (TYLENOL) 500 MG tablet Take 2 tablets by mouth daily as needed.    Marland Kitchen amLODipine (NORVASC) 10 MG tablet TAKE ONE-HALF TABLET BY  MOUTH TWICE A DAY (Patient taking differently: Take 5 mg by mouth daily. TAKE ONE-HALF TABLET BY  MOUTH DAILY.) 90 tablet 3  . aspirin EC 81 MG tablet Take 81 mg by mouth daily.    Marland Kitchen b complex vitamins capsule Take 1 capsule by mouth daily.    . Ca Carbonate-Mag Hydroxide (ROLAIDS PO) Take by mouth as needed.    . famotidine (PEPCID) 20 MG tablet TAKE 1 TABLET BY MOUTH TWO  TIMES DAILY (Patient taking differently: Take 20 mg by mouth as needed. ) 90 tablet 3  . fluticasone (FLONASE) 50 MCG/ACT nasal spray Place 2 sprays into both nostrils as needed for allergies or rhinitis.    Marland Kitchen gabapentin (NEURONTIN) 100 MG capsule Take by mouth.    . Latanoprost 0.005 % EMUL Apply 1 drop to eye daily. 1 drop in each eye daily    . loratadine (CLARITIN) 10 MG tablet Take 10 mg by mouth daily as needed for allergies.    . Melatonin 5 MG CAPS Take 1 capsule by mouth at bedtime.    . Multiple Vitamins-Minerals (MULTIVITAMIN WITH MINERALS) tablet Take 1 tablet by mouth daily.    . pantoprazole (PROTONIX) 40  MG tablet Take 40 mg by mouth as needed.    . potassium chloride SA (KLOR-CON M20) 20 MEQ tablet 40mg  in the morning 20mg  at night 270 tablet 3  . rosuvastatin (CRESTOR) 40 MG tablet Take 1 tablet (40 mg total) by mouth daily. 90 tablet 3  . tamsulosin (FLOMAX) 0.4 MG CAPS capsule Take 0.4 mg by mouth daily.    . timolol (TIMOPTIC) 0.5 % ophthalmic solution Place 1 drop into both eyes 2 (two) times daily.    Marland Kitchen VITAMIN D, CHOLECALCIFEROL, PO Take by mouth.    . vitamin E 400 UNIT capsule Take 400 Units by mouth daily.     No current facility-administered medications for this visit.    REVIEW OF SYSTEMS:  [X]  denotes positive finding, [ ]  denotes negative finding Cardiac  Comments:  Chest pain or chest pressure:    Shortness of breath upon exertion:      Short of breath when lying flat:    Irregular heart rhythm:        Vascular    Pain in calf, thigh, or hip brought on by ambulation:    Pain in feet at night that wakes you up from your sleep:     Blood clot in your veins:    Leg swelling:           PHYSICAL EXAM: Vitals:   03/19/20 1359  BP: (!) 160/96  Pulse: 55  Resp: 18  Temp: 97.7 F (36.5 C)  TempSrc: Temporal  SpO2: 96%  Weight: 181 lb 9.6 oz (82.4 kg)  Height: 5\' 11"  (1.803 m)    GENERAL: The patient is a well-nourished male, in no acute distress. The vital signs are documented above. CARDIOVASCULAR: Carotid arteries without bruits bilaterally.  2+ radial pulses bilaterally. PULMONARY: There is good air exchange  MUSCULOSKELETAL: There are no major deformities or cyanosis. NEUROLOGIC: No focal weakness or paresthesias are detected. SKIN: There are no ulcers or rashes noted. PSYCHIATRIC: The patient has a normal affect.  DATA:  Duplex today in our office revealed moderate carotid disease bilaterally in the 40 to 59% range.  MEDICAL ISSUES: Discussed these findings at length with the patient.  Did review symptoms of carotid disease and he knows to report immediately to the emergency room should this occur.  Otherwise he will undergo repeat duplex in 1 year.    Rosetta Posner, MD FACS Vascular and Vein Specialists of [facility] Tel [phone] Pager [phone]

## 2020-04-11 ENCOUNTER — Other Ambulatory Visit: Payer: Self-pay

## 2020-04-11 ENCOUNTER — Encounter: Payer: Self-pay | Admitting: Cardiology

## 2020-04-11 ENCOUNTER — Ambulatory Visit (INDEPENDENT_AMBULATORY_CARE_PROVIDER_SITE_OTHER): Payer: Medicare Other | Admitting: Cardiology

## 2020-04-11 VITALS — BP 122/78 | HR 74 | Ht 72.0 in | Wt 183.2 lb

## 2020-04-11 DIAGNOSIS — R7303 Prediabetes: Secondary | ICD-10-CM

## 2020-04-11 DIAGNOSIS — R0609 Other forms of dyspnea: Secondary | ICD-10-CM

## 2020-04-11 DIAGNOSIS — I6523 Occlusion and stenosis of bilateral carotid arteries: Secondary | ICD-10-CM | POA: Diagnosis not present

## 2020-04-11 DIAGNOSIS — R0600 Dyspnea, unspecified: Secondary | ICD-10-CM | POA: Diagnosis not present

## 2020-04-11 DIAGNOSIS — I2510 Atherosclerotic heart disease of native coronary artery without angina pectoris: Secondary | ICD-10-CM

## 2020-04-11 NOTE — Progress Notes (Signed)
Cardiology Office Note:    Date:  04/11/2020   ID:  Darrell Williams, DOB 12-13-1945, MRN [id number]  PCP:  Algis Greenhouse, MD  Cardiologist:  Jenne Campus, MD    Referring MD: Algis Greenhouse, MD   No chief complaint on file. I am doing fine  History of Present Illness:    Darrell Williams is a 74 y.o. male  with past medical history significant for peripheral vascular disease, cardiac arterial disease, coronary disease with CT angios showing 25% proximal LAD in 2011. Recently he ended up having some abdominal catastrophe. He ended being in [facility] hospital after that he was transferred to [facility] he lost about 30 pounds but now recovering and doing much better I see him last time because of swelling of lower extremities. Suspicion was for potentially having cardiomyopathy. Luckily his echocardiogram showed normal/preserved left ventricle ejection fraction. Comes today 2 months of follow-up.  Overall doing well.  Denies have any chest pain tightness squeezing pressure burning chest.  I was very cheerful I will talk a lot about different things.  Past Medical History:  Diagnosis Date  . At risk for sleep apnea    STOP-BANG= 4    SENT TO PCP 07-16-2014  . Bilateral carotid artery stenosis    Bilateral ICA  60-79%  per last duplex 02-21-2014  . Bilateral ureteral calculi   . BPH (benign prostatic hypertrophy)   . Coronary atherosclerosis of native coronary artery    cardiologist-  dr bensinhom--  pLAD 25%  per CT scan  . Frequency of urination   . Glaucoma of both eyes   . Hyperlipidemia   . Hypertension   . Plaque    cholesterol plaque--on his retina exam  . Urgency of urination   . Wears glasses     Past Surgical History:  Procedure Laterality Date  . CARDIOVASCULAR STRESS TEST  08-12-2005  dr Ron Parker   normal nuclear study/  no ischemia /  ef 67%---  11/ 2009  normal ETT  . CATARACT EXTRACTION W/ INTRAOCULAR LENS  IMPLANT, BILATERAL    . COLONOSCOPY  2005  (approx)  . CYSTOSCOPY WITH RETROGRADE PYELOGRAM, URETEROSCOPY AND STENT PLACEMENT Bilateral 07/23/2014   Procedure: CYSTOSCOPY WITH BILATERAL RETROGRADE PYELOGRAM, BILATERAL URETEROSCOPY AND STENT PLACEMENT, STONE EXTRACTION;  Surgeon: Bernestine Amass, MD;  Location: [facility];  Service: Urology;  Laterality: Bilateral;  . HOLMIUM LASER APPLICATION Bilateral 19/62/2297   Procedure: HOLMIUM LASER APPLICATION;  Surgeon: Bernestine Amass, MD;  Location: [facility];  Service: Urology;  Laterality: Bilateral;  . INGUINAL HERNIA REPAIR Bilateral 1995  (approx)    Current Medications: Current Meds  Medication Sig  . acetaminophen (TYLENOL) 500 MG tablet Take 2 tablets by mouth daily as needed.  Marland Kitchen amLODipine (NORVASC) 10 MG tablet TAKE ONE-HALF TABLET BY  MOUTH TWICE A DAY  . aspirin EC 81 MG tablet Take 81 mg by mouth daily.  Marland Kitchen b complex vitamins capsule Take 1 capsule by mouth daily.  . Ca Carbonate-Mag Hydroxide (ROLAIDS PO) Take by mouth as needed.  . famotidine (PEPCID) 20 MG tablet TAKE 1 TABLET BY MOUTH TWO  TIMES DAILY (Patient taking differently: TAKE 1 TABLET BY MOUTH TWO  TIMES DAILY)  . fluticasone (FLONASE) 50 MCG/ACT nasal spray Place 2 sprays into both nostrils as needed for allergies or rhinitis.  Marland Kitchen gabapentin (NEURONTIN) 100 MG capsule Take 100 mg by mouth daily.   Marland Kitchen latanoprost (XALATAN) 0.005 % ophthalmic solution  Place 1 drop into both eyes at bedtime.  Marland Kitchen loratadine (CLARITIN) 10 MG tablet Take 10 mg by mouth daily as needed for allergies.  . Melatonin 5 MG CAPS Take 1 capsule by mouth at bedtime.  . montelukast (SINGULAIR) 10 MG tablet Take 10 mg by mouth at bedtime.  . Multiple Vitamins-Minerals (MULTIVITAMIN WITH MINERALS) tablet Take 1 tablet by mouth daily.  . Omega-3 1000 MG CAPS Take 1 capsule by mouth daily.  . pantoprazole (PROTONIX) 40 MG tablet Take 40 mg by mouth as needed.  . potassium chloride SA (KLOR-CON M20) 20 MEQ tablet 40mg  in  the morning 20mg  at night  . rosuvastatin (CRESTOR) 40 MG tablet Take 1 tablet (40 mg total) by mouth daily.  . tamsulosin (FLOMAX) 0.4 MG CAPS capsule Take 0.4 mg by mouth in the morning and at bedtime.   . timolol (TIMOPTIC) 0.5 % ophthalmic solution Place 1 drop into both eyes 2 (two) times daily.  Marland Kitchen VITAMIN D, CHOLECALCIFEROL, PO Take by mouth.  . vitamin E 400 UNIT capsule Take 400 Units by mouth daily.     Allergies:   Ace inhibitors and Codeine   Social History   Socioeconomic History  . Marital status: Single    Spouse name: Not on file  . Number of children: Not on file  . Years of education: Not on file  . Highest education level: Not on file  Occupational History  . Not on file  Tobacco Use  . Smoking status: Never Smoker  . Smokeless tobacco: Never Used  Vaping Use  . Vaping Use: Never used  Substance and Sexual Activity  . Alcohol use: Yes    Comment: occasional  . Drug use: No  . Sexual activity: Not on file  Other Topics Concern  . Not on file  Social History Narrative  . Not on file   Social Determinants of Health   Financial Resource Strain:   . Difficulty of Paying Living Expenses: Not on file  Food Insecurity:   . Worried About Charity fundraiser in the Last Year: Not on file  . Ran Out of Food in the Last Year: Not on file  Transportation Needs:   . Lack of Transportation (Medical): Not on file  . Lack of Transportation (Non-Medical): Not on file  Physical Activity:   . Days of Exercise per Week: Not on file  . Minutes of Exercise per Session: Not on file  Stress:   . Feeling of Stress : Not on file  Social Connections:   . Frequency of Communication with Friends and Family: Not on file  . Frequency of Social Gatherings with Friends and Family: Not on file  . Attends Religious Services: Not on file  . Active Member of Clubs or Organizations: Not on file  . Attends Archivist Meetings: Not on file  . Marital Status: Not on file       Family History: The patient's family history includes Diabetes (age of onset: 54) in his paternal uncle; Hypertension in his father and mother; Stroke in his father. ROS:   Please see the history of present illness.    All 14 point review of systems negative except as described per history of present illness  EKGs/Labs/Other Studies Reviewed:      Recent Labs: No results found for requested labs within last 8760 hours.  Recent Lipid Panel    Component Value Date/Time   CHOL 169 10/11/2019 1351   TRIG 73 10/11/2019 1351  TRIG 68 07/14/2006 1515   HDL 67 10/11/2019 1351   CHOLHDL 2.5 10/11/2019 1351   CHOLHDL 2.5 07/27/2018 1522   VLDL 21 07/27/2018 1522   LDLCALC 88 10/11/2019 1351    Physical Exam:    VS:  BP 122/78   Pulse 74   Ht 6' (1.829 m)   Wt 183 lb 3.2 oz (83.1 kg)   SpO2 96%   BMI 24.85 kg/m     Wt Readings from Last 3 Encounters:  04/11/20 183 lb 3.2 oz (83.1 kg)  03/19/20 181 lb 9.6 oz (82.4 kg)  10/10/19 181 lb 9.6 oz (82.4 kg)     GEN:  Well nourished, well developed in no acute distress HEENT: Normal NECK: No JVD; No carotid bruits LYMPHATICS: No lymphadenopathy CARDIAC: RRR, no murmurs, no rubs, no gallops RESPIRATORY:  Clear to auscultation without rales, wheezing or rhonchi  ABDOMEN: Soft, non-tender, non-distended MUSCULOSKELETAL:  No edema; No deformity  SKIN: Warm and dry LOWER EXTREMITIES: no swelling NEUROLOGIC:  Alert and oriented x 3 PSYCHIATRIC:  Normal affect   ASSESSMENT:    1. Atherosclerosis of native coronary artery of native heart without angina pectoris   2. Prediabetes   3. Dyspnea on exertion   4. Bilateral carotid artery stenosis    PLAN:    In order of problems listed above:  1. Atherosclerosis with native coronary artery disease.  Asymptomatic on appropriate medication which I will continue. 2. Prediabetes we talked a lot about exercises.  He is upset because he did not gain much weight on top of that he said  he is not building any muscle we realized that he does not do much and I told him he need to be more active him to start walking and exercising potentially doing some weightlifting. 3. Dyspnea exertion hopefully will be better with exercises on the regular basis. 4. Bilateral carotic arterial stenosis to be followed by group from [facility]   Medication Adjustments/Labs and Tests Ordered: Current medicines are reviewed at length with the patient today.  Concerns regarding medicines are outlined above.  No orders of the defined types were placed in this encounter.  Medication changes: No orders of the defined types were placed in this encounter.   Signed, Park Liter, MD, [facility] 04/11/2020 5:07 PM    [place]

## 2020-04-11 NOTE — Patient Instructions (Signed)

## 2020-07-09 ENCOUNTER — Telehealth: Payer: Self-pay | Admitting: Emergency Medicine

## 2020-07-09 DIAGNOSIS — I2510 Atherosclerotic heart disease of native coronary artery without angina pectoris: Secondary | ICD-10-CM

## 2020-07-09 DIAGNOSIS — Z79899 Other long term (current) drug therapy: Secondary | ICD-10-CM

## 2020-07-09 NOTE — Telephone Encounter (Signed)
Patient came in office  thinking he needed to get labs drawn. There are no active orders and there was not mention of this at last appointment August 2021. Will consult with Dr. Agustin Cree to see if any labs are needed if so will inform patient.

## 2020-07-10 NOTE — Addendum Note (Signed)
Addended by: Senaida Ores on: 07/10/2020 12:14 PM   Modules accepted: Orders

## 2020-07-10 NOTE — Telephone Encounter (Signed)
Called patient. Informed him that his lab ordered have been placed and he can come back to have drawn. He verbally understood no further questions.

## 2020-07-10 NOTE — Telephone Encounter (Signed)
Lets get fasting lipid profile and basic metabolic panel

## 2020-08-10 ENCOUNTER — Other Ambulatory Visit: Payer: Self-pay | Admitting: Cardiology

## 2020-08-12 NOTE — Telephone Encounter (Signed)
Refill sent to pharmacy.   

## 2020-08-24 DIAGNOSIS — K219 Gastro-esophageal reflux disease without esophagitis: Secondary | ICD-10-CM | POA: Insufficient documentation

## 2020-09-09 ENCOUNTER — Other Ambulatory Visit: Payer: Self-pay

## 2020-09-09 ENCOUNTER — Telehealth: Payer: Self-pay | Admitting: Cardiology

## 2020-09-09 ENCOUNTER — Telehealth: Payer: Self-pay | Admitting: Family Medicine

## 2020-09-09 MED ORDER — AMLODIPINE BESYLATE 10 MG PO TABS: ORAL_TABLET | ORAL | 3 refills | Status: DC

## 2020-09-09 NOTE — Telephone Encounter (Signed)
Copied from [place] [id number]. Topic: General - Other >> Sep 09, 2020  1:24 PM Leward Quan A wrote: Reason for CRM: Patient requesting a call back to discuss Covid and some symptoms. Please call Ph# [phone]

## 2020-09-09 NOTE — Telephone Encounter (Signed)
Left vm to call back

## 2020-09-09 NOTE — Telephone Encounter (Signed)
    Pt c/o medication issue:  1. Name of Medication:amLODipine (NORVASC) 10 MG tablet  2. How are you currently taking this medication (dosage and times per day)?   3. Are you having a reaction (difficulty breathing--STAT)?   4. What is your medication issue? Pt said he have question about this medication

## 2020-09-09 NOTE — Telephone Encounter (Signed)
Returned PT call. Pt was reading an article this morning regarding S/S of COVID.   Pt states he has several of these s/s.  Runny nose - 3-4 times a day Fatigue - Bu then states that he is old Cough - which he attributes to acid reflux. Chills - But it cold outside. Dizziness - Attributed to BP medicine And hoarseness - new onset.   Pt took a home test today - Negative result.   Pt has 3 vaccines - with no known exposures.   I suggested that he get a regular (PCR) test. Unable to help him secure a test as he lives in [place].He will call his HD to find a test in his area tomorrow.   Discussed other S/S of COVID and need for testing and medical care if S/S warrant.

## 2020-09-16 DIAGNOSIS — R490 Dysphonia: Secondary | ICD-10-CM

## 2020-09-16 HISTORY — DX: Dysphonia: R49.0

## 2020-10-03 ENCOUNTER — Other Ambulatory Visit: Payer: Self-pay

## 2020-10-03 DIAGNOSIS — R350 Frequency of micturition: Secondary | ICD-10-CM | POA: Insufficient documentation

## 2020-10-03 DIAGNOSIS — Z973 Presence of spectacles and contact lenses: Secondary | ICD-10-CM | POA: Insufficient documentation

## 2020-10-03 DIAGNOSIS — R3915 Urgency of urination: Secondary | ICD-10-CM | POA: Insufficient documentation

## 2020-10-03 DIAGNOSIS — N201 Calculus of ureter: Secondary | ICD-10-CM | POA: Insufficient documentation

## 2020-10-03 DIAGNOSIS — I10 Essential (primary) hypertension: Secondary | ICD-10-CM | POA: Insufficient documentation

## 2020-10-03 DIAGNOSIS — E785 Hyperlipidemia, unspecified: Secondary | ICD-10-CM | POA: Insufficient documentation

## 2020-10-03 DIAGNOSIS — I6523 Occlusion and stenosis of bilateral carotid arteries: Secondary | ICD-10-CM | POA: Insufficient documentation

## 2020-10-03 DIAGNOSIS — H409 Unspecified glaucoma: Secondary | ICD-10-CM | POA: Insufficient documentation

## 2020-10-03 DIAGNOSIS — Z9189 Other specified personal risk factors, not elsewhere classified: Secondary | ICD-10-CM | POA: Insufficient documentation

## 2020-10-11 ENCOUNTER — Telehealth: Payer: Self-pay | Admitting: Cardiology

## 2020-10-11 NOTE — Telephone Encounter (Signed)
    Pt would like to speak with a nurse, he would like to discus about getting his thyroid and glucocorticoids check

## 2020-10-14 NOTE — Telephone Encounter (Signed)
Left message for patient to return call.

## 2020-10-15 ENCOUNTER — Other Ambulatory Visit: Payer: Self-pay | Admitting: Cardiology

## 2020-10-15 ENCOUNTER — Encounter: Payer: Self-pay | Admitting: Cardiology

## 2020-10-15 ENCOUNTER — Ambulatory Visit (INDEPENDENT_AMBULATORY_CARE_PROVIDER_SITE_OTHER): Payer: Medicare Other | Admitting: Cardiology

## 2020-10-15 ENCOUNTER — Other Ambulatory Visit: Payer: Self-pay

## 2020-10-15 VITALS — BP 122/84 | HR 63 | Ht 72.0 in | Wt 189.0 lb

## 2020-10-15 DIAGNOSIS — N1832 Chronic kidney disease, stage 3b: Secondary | ICD-10-CM

## 2020-10-15 DIAGNOSIS — R7303 Prediabetes: Secondary | ICD-10-CM

## 2020-10-15 DIAGNOSIS — I6523 Occlusion and stenosis of bilateral carotid arteries: Secondary | ICD-10-CM | POA: Diagnosis not present

## 2020-10-15 DIAGNOSIS — E782 Mixed hyperlipidemia: Secondary | ICD-10-CM

## 2020-10-15 DIAGNOSIS — E785 Hyperlipidemia, unspecified: Secondary | ICD-10-CM

## 2020-10-15 DIAGNOSIS — I10 Essential (primary) hypertension: Secondary | ICD-10-CM | POA: Diagnosis not present

## 2020-10-15 LAB — BASIC METABOLIC PANEL
BUN/Creatinine Ratio: 16 (ref 10–24)
BUN: 23 mg/dL (ref 8–27)
CO2: 24 mmol/L (ref 20–29)
Calcium: 9.2 mg/dL (ref 8.6–10.2)
Chloride: 102 mmol/L (ref 96–106)
Creatinine, Ser: 1.48 mg/dL — ABNORMAL HIGH (ref 0.76–1.27)
GFR calc Af Amer: 52 mL/min/{1.73_m2} — ABNORMAL LOW (ref >59)
GFR calc non Af Amer: 45 mL/min/{1.73_m2} — ABNORMAL LOW (ref >59)
Glucose: 109 mg/dL — ABNORMAL HIGH (ref 65–99)
Potassium: 4.7 mmol/L (ref 3.5–5.2)
Sodium: 142 mmol/L (ref 134–144)

## 2020-10-15 LAB — LIPID PANEL
Chol/HDL Ratio: 2.7 ratio (ref 0.0–5.0)
Cholesterol, Total: 195 mg/dL (ref 100–199)
HDL: 73 mg/dL (ref >39)
LDL Chol Calc (NIH): 108 mg/dL — ABNORMAL HIGH (ref 0–99)
Triglycerides: 79 mg/dL (ref 0–149)
VLDL Cholesterol Cal: 14 mg/dL (ref 5–40)

## 2020-10-15 MED ORDER — EZETIMIBE 10 MG PO TABS: 10.0000 mg | ORAL_TABLET | Freq: Every day | ORAL | 1 refills | Status: DC

## 2020-10-15 NOTE — Telephone Encounter (Signed)
Refill sent to pharmacy.   

## 2020-10-15 NOTE — Patient Instructions (Signed)
Medication Instructions:  Your physician has recommended you make the following change in your medication:   START: Zetia 10 mg daily   *If you need a refill on your cardiac medications before your next appointment, please call your pharmacy*   Lab Work: Your physician recommends that you return for lab work 6 weeks: lipid  If you have labs (blood work) drawn today and your tests are completely normal, you will receive your results only by: Marland Kitchen MyChart Message (if you have MyChart) OR . A paper copy in the mail If you have any lab test that is abnormal or we need to change your treatment, we will call you to review the results.   Testing/Procedures: [place]   Follow-Up: At [facility], you and your health needs are our priority.  As part of our continuing mission to provide you with exceptional heart care, we have created designated Provider Care Teams.  These Care Teams include your primary Cardiologist (physician) and Advanced Practice Providers (APPs -  Physician Assistants and Nurse Practitioners) who all work together to provide you with the care you need, when you need it.  We recommend signing up for the patient portal called "MyChart".  Sign up information is provided on this After Visit Summary.  MyChart is used to connect with patients for Virtual Visits (Telemedicine).  Patients are able to view lab/test results, encounter notes, upcoming appointments, etc.  Non-urgent messages can be sent to your provider as well.   To learn more about what you can do with MyChart, go to [email or web address].    Your next appointment:   6 month(s)  The format for your next appointment:   In Person  Provider:   Jenne Campus, MD   Other Instructions  Ezetimibe Tablets What is this medicine? EZETIMIBE (ez ET i mibe) treats high cholesterol. Ezetimibe blocks the absorption of cholesterol from the stomach. It is used with lifestyle changes, like diet and exercise. It may be used  alone or with other medicines. This medicine may be used for other purposes; ask your health care provider or pharmacist if you have questions. COMMON BRAND NAME(S): Zetia What should I tell my health care provider before I take this medicine? They need to know if you have any of these conditions:  kidney disease  liver disease  muscle cramps, pain  muscle injury  thyroid disease  an unusual or allergic reaction to ezetimibe, other medicines, foods, dyes, or preservatives  pregnant or trying to get pregnant  breast-feeding How should I use this medicine? Take this medicine by mouth. Take it as directed on the prescription label at the same time every day. You can take it with or without food. If it upsets your stomach, take it with food. Keep taking it unless your health care provider tells you to stop. Take bile acid sequestrants at a different time of day than this medicine. Take this medicine 2 hours BEFORE or 4 hours AFTER bile acid sequestrants. Talk to your health care provider about the use of this medicine in children. While it may be prescribed for children as young as 10 for selected conditions, precautions do apply. Overdosage: If you think you have taken too much of this medicine contact a poison control center or emergency room at once. NOTE: This medicine is only for you. Do not share this medicine with others. What if I miss a dose? If you miss a dose, take it as soon as you can. If it is almost time for  your next dose, take only that dose. Do not take double or extra doses. What may interact with this medicine? Do not take this medicine with any of the following medications:  fenofibrate  gemfibrozil This medicine may also interact with the following medications:  antacids  cyclosporine  herbal medicines like red yeast rice  other medicines to lower cholesterol or triglycerides This list may not describe all possible interactions. Give your health care  provider a list of all the medicines, herbs, non-prescription drugs, or dietary supplements you use. Also tell them if you smoke, drink alcohol, or use illegal drugs. Some items may interact with your medicine. What should I watch for while using this medicine? Visit your health care provider for regular checks on your progress. Tell your health care provider if your symptoms do not start to get better or if they get worse. Your health care provider may tell you to stop taking this medicine if you develop muscle problems. If your muscle problems do not go away after stopping this medicine, contact your health care provider. Do not become pregnant while taking this medicine. Women should inform their health care provider if they wish to become pregnant or think they might be pregnant. There is potential for serious harm to an unborn child. Talk to your health care provider for more information. Do not breast-feed an infant while taking this medicine. Taking this medicine is only part of a total heart healthy program. Your health care provider may give you a special diet to follow. Avoid alcohol. Avoid smoking. Ask your health care provider how much you should exercise. What side effects may I notice from receiving this medicine? Side effects that you should report to your doctor or health care provider as soon as possible:  allergic reactions (skin rash, itching or hives; swelling of the face, lips, or tongue)  liver injury (dark yellow or brown urine; general ill feeling or flu-like symptoms; loss of appetite, right upper belly pain; unusually weak or tired, yellowing of the eyes or skin)  muscle injury (dark urine; trouble passing urine or change in the amount of urine; unusually weak or tired; muscle pain; back pain) Side effects that usually do not require medical attention (report to your doctor or health care provider if they continue or are bothersome):  depressed  mood  diarrhea  headache  infection (fever, chills, cough, sore throat, pain, or trouble passing urine)  joint pain  stomach pain This list may not describe all possible side effects. Call your doctor for medical advice about side effects. You may report side effects to FDA at 1-800-FDA-1088. Where should I keep my medicine? Keep out of the reach of children and pets. Store at room temperature between 15 and 30 degrees C (59 and 86 degrees F). Protect from moisture. Get rid of any unused medicine after the expiration date. NOTE: This sheet is a summary. It may not cover all possible information. If you have questions about this medicine, talk to your doctor, pharmacist, or health care provider.  2021 Elsevier/Gold Standard (2019-07-19 17:28:51)

## 2020-10-15 NOTE — Progress Notes (Signed)
Cardiology Office Note:    Date:  10/15/2020   ID:  Darrell Williams, DOB [birth year], MRN [id number]  PCP:  Algis Greenhouse, MD  Cardiologist:  Jenne Campus, MD    Referring MD: Algis Greenhouse, MD   Chief Complaint  Patient presents with  . Follow-up    History of Present Illness:    Darrell Williams is a 75 y.o. 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  He ended being in [facility] the hospital after that transfer to [facility] lost 30 pounds however survived all this and overall seems to be doing well he is actually improving and getting better.  He admits that he is lazy he does not want to do anything at the same time try to be a little more active.  He denies have any chest pain tightness squeezing pressure burning chest no palpitations no dizziness no swelling of lower extremities  Past Medical History:  Diagnosis Date  . Abdominal pain 01/07/2019  . Acute pain of right shoulder 09/27/2018   2020  . Allergic rhinitis 05/06/2016  . At risk for sleep apnea    STOP-BANG= 4    SENT TO PCP 07-16-2014  . Bilateral carotid artery stenosis    Bilateral ICA  60-79%  per last duplex 02-21-2014  . Bilateral lower extremity edema 04/14/2019   2020  . Bilateral ureteral calculi   . BPH (benign prostatic hypertrophy)   . Carbuncle 12/01/2019   Formatting of this note might be different from the original. 2021: left buttocks 2021: left buttocks, recurrent  . Carotid stenosis 07/25/2009   Qualifier: Diagnosis of  By: Al-Rammal, RVT, RDCS, Missy    . CHEST PAIN, PRECORDIAL 04/14/2010   Qualifier: Diagnosis of  By: Haroldine Laws, MD, Eileen Stanford Coronary atherosclerosis of native coronary artery    cardiologist-  dr bensinhom--  pLAD 25%  per CT scan  . Cough variant asthma 05/29/2016   See pfts 05/13/16  Ratios are nl  but there is curvature to f/v on no rx and s gerd rx which was started 05/29/2016   . Dizziness 04/26/2018   2019  . Dyspnea on exertion 01/16/2009   Qualifier: Diagnosis of  By: Haroldine Laws, MD, Eileen Stanford   . Elevated PSA 07/21/2016   Overview:  Managed UROL  . Frequency of urination   . Glaucoma 05/06/2016  . Glaucoma of both eyes   . Helicobacter pylori (H. pylori) infection 01/20/2019   2020: 4 drug treatment, [facility]  . Hoarseness 09/16/2020  . Hyperlipidemia   . Hyperlipidemia with target LDL less than 70 01/16/2009   Qualifier: Diagnosis of  By: Selena Batten CMA, Jewel    . Hypertension   . HYPERTENSION, BENIGN 01/16/2009   Qualifier: Diagnosis of  By: Selena Batten CMA, Jewel    . HYPOKALEMIA 01/16/2009   Qualifier: Diagnosis of  By: Haroldine Laws, MD, Eileen Stanford Nephrolithiasis 07/23/2014  . 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 Plaque    cholesterol plaque--on his retina exam  . Prediabetes 08/02/2018   2019: cards labs: x/6.2  . Sebaceous cyst 02/13/2020   Formatting of this note might be different from the original. 2021: back  . Swelling of both lower extremities 05/18/2019  .  Unilateral recurrent inguinal hernia without obstruction or gangrene 10/30/2018   1980: [facility] 2020: recurrent right  . Upper airway cough syndrome 05/29/2016   PFT's 05/13/16 FEV1  1.53 (44%) ratio 72 with severely truncated insp loop and mildly concave curvature to expiratory portion Max GERD 05/29/2016 >>>  - FENO 05/29/2016  =   19  - Allergy profile 05/29/2016 >  Eos 0.2 /  IgE  170 neg RAST  - Spirometry 07/14/2016  FEV1 1.81 (53%)  Ratio 80 with no curvature at all  > try singulair x 30 days and then ent eval if not better  - Eval by Dr Joya Gaskins 08/13/16  . Ureteral calculi 07/23/2014  . Urgency of urination   . Wart 05/06/2016   Overview:  2017: RUE  . Wears glasses   . Wellness examination 05/20/2016    Past Surgical  History:  Procedure Laterality Date  . CARDIOVASCULAR STRESS TEST  08-12-2005  dr Ron Parker   normal nuclear study/  no ischemia /  ef 67%---  11/ 2009  normal ETT  . CATARACT EXTRACTION W/ INTRAOCULAR LENS  IMPLANT, BILATERAL    . COLONOSCOPY  2005 (approx)  . CYSTOSCOPY WITH RETROGRADE PYELOGRAM, URETEROSCOPY AND STENT PLACEMENT Bilateral 07/23/2014   Procedure: CYSTOSCOPY WITH BILATERAL RETROGRADE PYELOGRAM, BILATERAL URETEROSCOPY AND STENT PLACEMENT, STONE EXTRACTION;  Surgeon: Bernestine Amass, MD;  Location: [facility];  Service: Urology;  Laterality: Bilateral;  . HOLMIUM LASER APPLICATION Bilateral 63/89/3734   Procedure: HOLMIUM LASER APPLICATION;  Surgeon: Bernestine Amass, MD;  Location: [facility];  Service: Urology;  Laterality: Bilateral;  . INGUINAL HERNIA REPAIR Bilateral 1995  (approx)    Current Medications: Current Meds  Medication Sig  . amLODipine (NORVASC) 10 MG tablet TAKE ONE-HALF TABLET BY  MOUTH TWICE A DAY  . aspirin EC 81 MG tablet Take 81 mg by mouth daily.  Marland Kitchen b complex vitamins capsule Take 1 capsule by mouth daily.  Marland Kitchen ezetimibe (ZETIA) 10 MG tablet Take 1 tablet (10 mg total) by mouth daily.  . famotidine (PEPCID) 20 MG tablet TAKE 1 TABLET BY MOUTH TWO  TIMES DAILY (Patient taking differently: Take 20 mg by mouth as needed.)  . fluticasone (FLONASE) 50 MCG/ACT nasal spray Place 2 sprays into both nostrils as needed for allergies or rhinitis.  Marland Kitchen gabapentin (NEURONTIN) 100 MG capsule Take 100 mg by mouth daily.   Marland Kitchen KLOR-CON M20 20 MEQ tablet TAKE 2 TABLETS BY MOUTH IN  THE MORNING AND 1 TABLET BY MOUTH AT NIGHT  . latanoprost (XALATAN) 0.005 % ophthalmic solution Place 1 drop into both eyes at bedtime.  Marland Kitchen loratadine (CLARITIN) 10 MG tablet Take 10 mg by mouth daily as needed for allergies.  . Melatonin 5 MG CAPS Take 1 capsule by mouth at bedtime.  . Multiple Vitamins-Minerals (MULTIVITAMIN WITH MINERALS) tablet Take 1 tablet by mouth  daily.  . Omega-3 1000 MG CAPS Take 1 capsule by mouth daily.  . pantoprazole (PROTONIX) 40 MG tablet Take 40 mg by mouth as needed.  . rosuvastatin (CRESTOR) 40 MG tablet TAKE 1 TABLET BY MOUTH  DAILY  . tamsulosin (FLOMAX) 0.4 MG CAPS capsule Take 0.4 mg by mouth in the morning and at bedtime.   . timolol (TIMOPTIC) 0.5 % ophthalmic solution Place 1 drop into both eyes 2 (two) times daily.  Marland Kitchen VITAMIN D, CHOLECALCIFEROL, PO Take by mouth.  . vitamin E 400 UNIT capsule Take 400 Units by mouth daily.  . [DISCONTINUED] timolol (TIMOPTIC)  0.25 % ophthalmic solution Place 1 drop into both eyes daily.     Allergies:   Ace inhibitors and Codeine   Social History   Socioeconomic History  . Marital status: Single    Spouse name: Not on file  . Number of children: Not on file  . Years of education: Not on file  . Highest education level: Not on file  Occupational History  . Not on file  Tobacco Use  . Smoking status: Never Smoker  . Smokeless tobacco: Never Used  Vaping Use  . Vaping Use: Never used  Substance and Sexual Activity  . Alcohol use: Yes    Comment: occasional  . Drug use: No  . Sexual activity: Not on file  Other Topics Concern  . Not on file  Social History Narrative  . Not on file   Social Determinants of Health   Financial Resource Strain: Not on file  Food Insecurity: Not on file  Transportation Needs: Not on file  Physical Activity: Not on file  Stress: Not on file  Social Connections: Not on file     Family History: The patient's family history includes Diabetes (age of onset: 81) in his paternal uncle; Hypertension in his father and mother; Stroke in his father. ROS:   Please see the history of present illness.    All 14 point review of systems negative except as described per history of present illness  EKGs/Labs/Other Studies Reviewed:      Recent Labs: 10/14/2020: BUN 23; Creatinine, Ser 1.48; Potassium 4.7; Sodium 142  Recent Lipid Panel     Component Value Date/Time   CHOL 195 10/14/2020 1357   TRIG 79 10/14/2020 1357   TRIG 68 07/14/2006 1515   HDL 73 10/14/2020 1357   CHOLHDL 2.7 10/14/2020 1357   CHOLHDL 2.5 07/27/2018 1522   VLDL 21 07/27/2018 1522   LDLCALC 108 (H) 10/14/2020 1357    Physical Exam:    VS:  BP 122/84 (BP Location: Left Arm, Patient Position: Sitting)   Pulse 63   Ht 6' (1.829 m)   Wt 189 lb (85.7 kg)   SpO2 94%   BMI 25.63 kg/m     Wt Readings from Last 3 Encounters:  10/15/20 189 lb (85.7 kg)  04/11/20 183 lb 3.2 oz (83.1 kg)  03/19/20 181 lb 9.6 oz (82.4 kg)     GEN:  Well nourished, well developed in no acute distress HEENT: Normal NECK: No JVD; No carotid bruits LYMPHATICS: No lymphadenopathy CARDIAC: RRR, no murmurs, no rubs, no gallops RESPIRATORY:  Clear to auscultation without rales, wheezing or rhonchi  ABDOMEN: Soft, non-tender, non-distended MUSCULOSKELETAL:  No edema; No deformity  SKIN: Warm and dry LOWER EXTREMITIES: no swelling NEUROLOGIC:  Alert and oriented x 3 PSYCHIATRIC:  Normal affect   ASSESSMENT:    1. HYPERTENSION, BENIGN   2. Hyperlipidemia with target LDL less than 70   3. Bilateral carotid artery stenosis   4. Stage 3b chronic kidney disease ([place])   5. Mixed hyperlipidemia   6. Prediabetes    PLAN:    In order of problems listed above:  1. Essential hypertension blood pressure well controlled continue present management. 2. Dyslipidemia we did fasting lipid profile yesterday his LDL is at 108.  He is taking Crestor 40 I will add Zetia 10 mg to his medical regimen. 3. Bilateral carotic arterial stenosis latest evaluation did not critical lesion we will continue monitoring. 4. Chronic kidney failure noted, stable we will continue monitoring encourage  him to drink plenty of fluid. 5. Prediabetes encouraged him to be more active.   Medication Adjustments/Labs and Tests Ordered: Current medicines are reviewed at length with the patient today.   Concerns regarding medicines are outlined above.  Orders Placed This Encounter  Procedures  . Lipid panel  . EKG 12-Lead   Medication changes:  Meds ordered this encounter  Medications  . ezetimibe (ZETIA) 10 MG tablet    Sig: Take 1 tablet (10 mg total) by mouth daily.    Dispense:  90 tablet    Refill:  1    Signed, Park Liter, MD, [facility] 10/15/2020 5:04 PM    [place]

## 2020-10-15 NOTE — Telephone Encounter (Signed)
Spoke to patient he will ask Dr. Agustin Cree at his visit today.

## 2021-01-07 ENCOUNTER — Telehealth: Payer: Self-pay | Admitting: Cardiology

## 2021-01-07 NOTE — Telephone Encounter (Signed)
Darrell Williams is calling requesting a callback from a nurse to go over his medications to make sure he is taking everything correctly. He states if you cannot reach him today call him back tomorrow after 10:00 AM. Please advise.

## 2021-01-09 NOTE — Telephone Encounter (Signed)
PT is returning a call 

## 2021-01-09 NOTE — Telephone Encounter (Signed)
Left message for patient to return call.

## 2021-01-09 NOTE — Telephone Encounter (Signed)
Patient spent 15 min discussing his frustration with the phone system. He is very unhappy with how difficult it is to reach our office. Offered mychart to him as this may be quicker for him to reach [place]. Apologized for inconvenience. Will pass complaint along to Engineer, building services.

## 2021-01-09 NOTE — Telephone Encounter (Signed)
Called patient went over all medications in chart with medications.

## 2021-01-09 NOTE — Telephone Encounter (Signed)
He wanted me to clarify that he is taking zetia and crestor together. Verified that he is to do this.

## 2021-01-28 ENCOUNTER — Telehealth: Payer: Self-pay | Admitting: Cardiology

## 2021-01-28 NOTE — Telephone Encounter (Signed)
Has bilateral foot/ankle swelling occurring more and more often. Pt is wondering if his medication needs to be changed. Please call to advise [phone]  Thank you!  Ammie Dalton

## 2021-01-28 NOTE — Addendum Note (Signed)
Addended by: Truddie Hidden on: 01/28/2021 01:29 PM   Modules accepted: Orders

## 2021-01-28 NOTE — Telephone Encounter (Signed)
Pt has been having reoccurring episodes of bilateral foot/ankle swelling. Pt would like to know if his medication needs to be adjusted. Please call [phone] to advise.  Thank you!~  Ammie Dalton

## 2021-01-28 NOTE — Telephone Encounter (Signed)
Spoke with pt and he is concerned with his lft and kidney fuction. Spoke with Dr. Agustin Cree and he was okay adding those orders. Orders placed and will be added to labs done today. Pt is aware.

## 2021-01-29 ENCOUNTER — Telehealth: Payer: Self-pay

## 2021-01-29 DIAGNOSIS — Z79899 Other long term (current) drug therapy: Secondary | ICD-10-CM

## 2021-01-29 LAB — HEPATIC FUNCTION PANEL
ALT: 49 IU/L — ABNORMAL HIGH (ref 0–44)
AST: 35 IU/L (ref 0–40)
Albumin: 4.5 g/dL (ref 3.7–4.7)
Alkaline Phosphatase: 81 IU/L (ref 44–121)
Bilirubin Total: 0.7 mg/dL (ref 0.0–1.2)
Bilirubin, Direct: 0.16 mg/dL (ref 0.00–0.40)
Total Protein: 7 g/dL (ref 6.0–8.5)

## 2021-01-29 LAB — LIPID PANEL
Chol/HDL Ratio: 2.8 ratio (ref 0.0–5.0)
Cholesterol, Total: 170 mg/dL (ref 100–199)
HDL: 60 mg/dL (ref >39)
LDL Chol Calc (NIH): 92 mg/dL (ref 0–99)
Triglycerides: 102 mg/dL (ref 0–149)
VLDL Cholesterol Cal: 18 mg/dL (ref 5–40)

## 2021-01-29 LAB — BASIC METABOLIC PANEL
BUN/Creatinine Ratio: 16 (ref 10–24)
BUN: 30 mg/dL — ABNORMAL HIGH (ref 8–27)
CO2: 26 mmol/L (ref 20–29)
Calcium: 9.5 mg/dL (ref 8.6–10.2)
Chloride: 99 mmol/L (ref 96–106)
Creatinine, Ser: 1.86 mg/dL — ABNORMAL HIGH (ref 0.76–1.27)
Glucose: 110 mg/dL — ABNORMAL HIGH (ref 65–99)
Potassium: 4.2 mmol/L (ref 3.5–5.2)
Sodium: 142 mmol/L (ref 134–144)
eGFR: 37 mL/min/{1.73_m2} — ABNORMAL LOW (ref >59)

## 2021-01-29 NOTE — Telephone Encounter (Signed)
-----   Message from Park Liter, MD sent at 01/29/2021  2:24 PM EDT ----- His kidney function test looks worse.  Please forward this test to primary care physician but need to be addressed by them.  In terms of his cholesterol cholesterol is acceptable, continue present management

## 2021-01-29 NOTE — Telephone Encounter (Signed)
Patient notified of results. Verbalized understanding. Results sent to PCP and per patient request verbally to sent to Dr. Hollie Salk at [facility]. Results sent

## 2021-01-30 NOTE — Telephone Encounter (Signed)
Patient is following up, requesting to go over his lipid results again. He states he has concerns with his cholesterol. He states he also wants to discuss Hydrochlorothiazide.

## 2021-01-30 NOTE — Telephone Encounter (Signed)
Spoke to patient. Informed him of results of lipid panel and recent labs.  He also wants to reports that he has been having ankle swelling off and on the past 6 weeks. He has a old RX for hydrochlorothiazide 25 mg twice a day that he will take as needed for the ankle swelling and it will resolve. But the swelling will come back. He does not report shortness of breath he does not report any weight gain that he is aware of. He does take amlodipine informed him ankle swelling could be a side effect from that. Since he doesn't seem to have normal fluid overload symptoms. Patient is worried it is from zetia possibly or another cholesterol medication I advised him this is not likely. Will check with Dr. Agustin Cree and follow up with patient. Patient also wants clarification if Dr. Agustin Cree ok with him taking hydrochlorothiazide and if so if we will send in a new script.

## 2021-01-30 NOTE — Telephone Encounter (Signed)
Called patient. Informed him of Dr. Wendy Poet recommendation. Patient already takes potassium 60 meq daily. Will confirm Dr. Agustin Cree still wants patient to start lasix 40 mg daily. Patient really prefers to not take daily unless Dr. Agustin Cree thinks he needs to he was more agreeable to taking just as needed. Will check with Dr. Agustin Cree before sending anything in.

## 2021-01-31 MED ORDER — FUROSEMIDE 40 MG PO TABS
40.0000 mg | ORAL_TABLET | ORAL | 0 refills | Status: DC | PRN
Start: 2021-01-31 — End: 2021-02-04

## 2021-01-31 NOTE — Addendum Note (Signed)
Addended by: Senaida Ores on: 01/31/2021 04:18 PM   Modules accepted: Orders

## 2021-01-31 NOTE — Telephone Encounter (Signed)
Called patient informed him to stop hydrochlorothiazide and only take lasix 40 mg as needed for swelling. Discussed this verbally under with Dr. Agustin Cree.  Patient aware to continue potassium as he was and have labs redrawn in 1 week. He understood no further questions.

## 2021-02-03 ENCOUNTER — Telehealth: Payer: Self-pay | Admitting: Cardiology

## 2021-02-03 NOTE — Telephone Encounter (Signed)
Spoke to patient. Informed him that since it is going to be taken only as needed then he cannot get 90 tablets at a time. Will call [facility] and confirm how many I can send for a as needed RX. Will follow up with patient as well as he has requested.

## 2021-02-03 NOTE — Telephone Encounter (Signed)
Pt c/o medication issue:  1. Name of Medication: furosemide (LASIX) 40 MG tablet  2. How are you currently taking this medication (dosage and times per day)? Not currently taking   3. Are you having a reaction (difficulty breathing--STAT)? No   4. What is your medication issue?  Darrell Williams is calling stating [facility] has been trying to contact the office over the weekend in regards to confirmation on how the patient is to take this medication. He states they are needing a daily dose verses "as needed" as it currently states. He is requesting a callback to inform him once this has been resolved. Please advise.

## 2021-02-04 MED ORDER — FUROSEMIDE 40 MG PO TABS: 40.0000 mg | ORAL_TABLET | Freq: Every day | ORAL | 3 refills | Status: DC | PRN

## 2021-02-04 NOTE — Telephone Encounter (Signed)
Spoke to [facility]. They wanted a updated RX to state lasix 40 mg take one daily as needed. Sent updated RX. Will fax as well. Patient aware. No further questions.

## 2021-03-07 ENCOUNTER — Other Ambulatory Visit: Payer: Self-pay | Admitting: Nephrology

## 2021-03-07 DIAGNOSIS — N1832 Chronic kidney disease, stage 3b: Secondary | ICD-10-CM

## 2021-03-07 DIAGNOSIS — N200 Calculus of kidney: Secondary | ICD-10-CM

## 2021-03-07 DIAGNOSIS — N2889 Other specified disorders of kidney and ureter: Secondary | ICD-10-CM

## 2021-03-14 ENCOUNTER — Other Ambulatory Visit: Payer: Self-pay

## 2021-03-14 DIAGNOSIS — I6523 Occlusion and stenosis of bilateral carotid arteries: Secondary | ICD-10-CM

## 2021-03-20 ENCOUNTER — Ambulatory Visit
Admission: RE | Admit: 2021-03-20 | Discharge: 2021-03-20 | Disposition: A | Discharge status: Home / Self Care | Payer: Medicare Other | Source: Ambulatory Visit | Attending: Nephrology | Admitting: Nephrology

## 2021-03-20 ENCOUNTER — Ambulatory Visit (HOSPITAL_COMMUNITY)
Admission: RE | Admit: 2021-03-20 | Discharge: 2021-03-20 | Disposition: A | Discharge status: Home / Self Care | Payer: Medicare Other | Source: Ambulatory Visit | Attending: Vascular Surgery | Admitting: Vascular Surgery

## 2021-03-20 ENCOUNTER — Other Ambulatory Visit: Payer: Self-pay

## 2021-03-20 ENCOUNTER — Ambulatory Visit (INDEPENDENT_AMBULATORY_CARE_PROVIDER_SITE_OTHER): Payer: Medicare Other | Admitting: Physician Assistant

## 2021-03-20 VITALS — BP 114/62 | HR 42 | Temp 97.6°F | Resp 20 | Ht 72.0 in | Wt 185.7 lb

## 2021-03-20 DIAGNOSIS — I6523 Occlusion and stenosis of bilateral carotid arteries: Secondary | ICD-10-CM

## 2021-03-20 DIAGNOSIS — N200 Calculus of kidney: Secondary | ICD-10-CM

## 2021-03-20 DIAGNOSIS — N2889 Other specified disorders of kidney and ureter: Secondary | ICD-10-CM

## 2021-03-20 DIAGNOSIS — N1832 Chronic kidney disease, stage 3b: Secondary | ICD-10-CM

## 2021-03-20 NOTE — Progress Notes (Signed)
Carotid Artery Follow-Up   VASCULAR SURGERY ASSESSMENT & PLAN:   Darrell Williams is a 75 y.o. male presents for routine surveillance of carotid stenosis.   Bilateral carotid artery stenosis: The patient has no symptoms referable to carotid artery stenosis.  Duplex examination today is stable as compared to 1 year ago.  We reviewed the signs and symptoms of stroke/TIA and advised the patient to call EMS should these occur.   Continue optimal medical management of hypertension and follow-up with primary care physician. No history of tobacco use. Continue the following medications: aspirin, statin Follow-up in 1 year with carotid duplex ultrasound.  SUBJECTIVE:   The patient denies monocular blindness, slurred speech, facial drooping, extremity weakness or numbness.  PHYSICAL EXAM:   Vitals:   03/20/21 1506 03/20/21 1515  BP: 110/64 114/62  Pulse: (!) 42   Resp: 20   Temp: 97.6 F (36.4 C)   SpO2: 96%      General appearance: Well-developed, well-nourished in no apparent distress Neurologic: Alert and oriented x4, face symmetric, speech fluent, 5 out of 5 bilateral upper extremity grip strength, triceps and biceps strength Cardiovascular: Heart rate and rhythm are regular.  Pedal pulses are palpable.  No carotid bruits. Respirations: Nonlabored Abdomen: No palpable pulsatile mass   NON-INVASIVE VASCULAR STUDIES   03/20/2021 Right Carotid: Velocities in the right ICA are consistent with a 40-59% stenosis.   Left Carotid: Velocities in the left ICA are consistent with a 40-59%  stenosis. The ECA appears >50% stenosed.   Vertebrals: Bilateral vertebral arteries demonstrate antegrade flow  PROBLEM LIST:    The patient's past medical history, past surgical history, family history, social history, allergy list and medication list are reviewed. No prior history of CVA or TIA.   CURRENT MEDS:    Current Outpatient Medications:    pantoprazole (PROTONIX) 40 MG tablet,  Take 1 tablet by mouth daily., Disp: , Rfl:    amLODipine (NORVASC) 10 MG tablet, TAKE ONE-HALF TABLET BY  MOUTH TWICE A DAY, Disp: 90 tablet, Rfl: 3   aspirin EC 81 MG tablet, Take 81 mg by mouth daily., Disp: , Rfl:    b complex vitamins capsule, Take 1 capsule by mouth daily., Disp: , Rfl:    ezetimibe (ZETIA) 10 MG tablet, Take 1 tablet (10 mg total) by mouth daily., Disp: 90 tablet, Rfl: 1   famotidine (PEPCID) 20 MG tablet, TAKE 1 TABLET BY MOUTH TWO  TIMES DAILY (Patient taking differently: Take 20 mg by mouth as needed.), Disp: 90 tablet, Rfl: 3   fluticasone (FLONASE) 50 MCG/ACT nasal spray, Place 2 sprays into both nostrils as needed for allergies or rhinitis., Disp: , Rfl:    furosemide (LASIX) 40 MG tablet, Take 1 tablet (40 mg total) by mouth daily as needed for edema., Disp: 90 tablet, Rfl: 3   gabapentin (NEURONTIN) 100 MG capsule, Take 100 mg by mouth daily. , Disp: , Rfl:    KLOR-CON M20 20 MEQ tablet, TAKE 2 TABLETS BY MOUTH IN  THE MORNING AND 1 TABLET BY MOUTH AT NIGHT, Disp: 270 tablet, Rfl: 3   latanoprost (XALATAN) 0.005 % ophthalmic solution, Place 1 drop into both eyes at bedtime., Disp: , Rfl:    loratadine (CLARITIN) 10 MG tablet, Take 10 mg by mouth daily as needed for allergies., Disp: , Rfl:    Melatonin 5 MG CAPS, Take 1 capsule by mouth at bedtime., Disp: , Rfl:    Multiple Vitamins-Minerals (MULTIVITAMIN WITH MINERALS) tablet, Take 1 tablet by  mouth daily., Disp: , Rfl:    Omega-3 1000 MG CAPS, Take 1 capsule by mouth daily., Disp: , Rfl:    pantoprazole (PROTONIX) 40 MG tablet, Take 40 mg by mouth as needed., Disp: , Rfl:    rosuvastatin (CRESTOR) 40 MG tablet, TAKE 1 TABLET BY MOUTH  DAILY, Disp: 90 tablet, Rfl: 3   tamsulosin (FLOMAX) 0.4 MG CAPS capsule, Take 0.4 mg by mouth in the morning and at bedtime. , Disp: , Rfl:    timolol (TIMOPTIC) 0.5 % ophthalmic solution, Place 1 drop into both eyes 2 (two) times daily., Disp: , Rfl:    VITAMIN D, CHOLECALCIFEROL,  PO, Take by mouth., Disp: , Rfl:    vitamin E 400 UNIT capsule, Take 400 Units by mouth daily., Disp: , Rfl:    REVIEW OF SYSTEMS:   '[X]'$  denotes positive finding, '[ ]'$  denotes negative finding Cardiac  Comments:  Chest pain or chest pressure:    Shortness of breath upon exertion:    Short of breath when lying flat:    Irregular heart rhythm:        Vascular    Pain in calf, thigh, or hip brought on by ambulation:    Pain in feet at night that wakes you up from your sleep:     Blood clot in your veins:    Leg swelling:         Pulmonary    Oxygen at home:    Productive cough:     Wheezing:         Neurologic    Sudden weakness in arms or legs:     Sudden numbness in arms or legs:     Sudden onset of difficulty speaking or slurred speech:    Temporary loss of vision in one eye:     Problems with dizziness:         Gastrointestinal    Blood in stool:     Vomited blood:         Genitourinary    Burning when urinating:     Blood in urine:        Psychiatric    Major depression:         Hematologic    Bleeding problems:    Problems with blood clotting too easily:        Skin    Rashes or ulcers:        Constitutional    Fever or chills:     Barbie Banner, PA-C  Office: [phone] 03/20/2021

## 2021-03-25 ENCOUNTER — Other Ambulatory Visit: Payer: Self-pay | Admitting: Cardiology

## 2021-03-25 NOTE — Telephone Encounter (Signed)
Refill request for Zetia 10 mg denied, was d/c 03/20/21 by other provider

## 2021-04-14 ENCOUNTER — Ambulatory Visit: Payer: Medicare Other | Admitting: Cardiology

## 2021-04-15 ENCOUNTER — Ambulatory Visit (INDEPENDENT_AMBULATORY_CARE_PROVIDER_SITE_OTHER): Payer: Medicare Other | Admitting: Cardiology

## 2021-04-15 ENCOUNTER — Other Ambulatory Visit: Payer: Self-pay

## 2021-04-15 ENCOUNTER — Telehealth: Payer: Self-pay | Admitting: *Deleted

## 2021-04-15 VITALS — BP 100/82 | HR 74 | Ht 72.0 in | Wt 182.4 lb

## 2021-04-15 DIAGNOSIS — I6523 Occlusion and stenosis of bilateral carotid arteries: Secondary | ICD-10-CM

## 2021-04-15 DIAGNOSIS — I10 Essential (primary) hypertension: Secondary | ICD-10-CM | POA: Diagnosis not present

## 2021-04-15 DIAGNOSIS — I2510 Atherosclerotic heart disease of native coronary artery without angina pectoris: Secondary | ICD-10-CM

## 2021-04-15 DIAGNOSIS — E782 Mixed hyperlipidemia: Secondary | ICD-10-CM

## 2021-04-15 MED ORDER — AMLODIPINE BESYLATE 5 MG PO TABS: 5.0000 mg | ORAL_TABLET | Freq: Every day | ORAL | 3 refills | Status: DC

## 2021-04-15 MED ORDER — FUROSEMIDE 40 MG PO TABS: 40.0000 mg | ORAL_TABLET | ORAL | 3 refills | Status: DC | PRN

## 2021-04-15 MED ORDER — EZETIMIBE 10 MG PO TABS: 10.0000 mg | ORAL_TABLET | Freq: Every day | ORAL | 3 refills | Status: DC

## 2021-04-15 NOTE — Telephone Encounter (Signed)
Called pt to let him know answers to questions he wanted to ask Dr. Agustin Cree. Dr. Agustin Cree does not recommend Niacin, has not had good results with it. In regards to the Zetia, it does not effect your kidneys or liver. Pt was driving and so said I could leave message on his cell phone.

## 2021-04-15 NOTE — Addendum Note (Signed)
Addended by: Jerl Santos R on: 04/15/2021 03:50 PM   Modules accepted: Orders

## 2021-04-15 NOTE — Patient Instructions (Signed)
Medication Instructions:  Your physician has recommended you make the following change in your medication:  DECREASE AMLODIPINE TO '5MG'$  ONCE DAILY STOP LASIX AND TAKE '40MG'$  AS NEEDED FOR SWELLING START ZETIA '10MG'$  ONCE DAILY  *If you need a refill on your cardiac medications before your next appointment, please call your pharmacy*   Lab Work: Your physician recommends that you return for lab work in: [place]  If you have labs (blood work) drawn today and your tests are completely normal, you will receive your results only by: [place] (if you have MyChart) OR A paper copy in the mail If you have any lab test that is abnormal or we need to change your treatment, we will call you to review the results.   Testing/Procedures: NONE   Follow-Up: At [facility], you and your health needs are our priority.  As part of our continuing mission to provide you with exceptional heart care, we have created designated Provider Care Teams.  These Care Teams include your primary Cardiologist (physician) and Advanced Practice Providers (APPs -  Physician Assistants and Nurse Practitioners) who all work together to provide you with the care you need, when you need it.  We recommend signing up for the patient portal called "MyChart".  Sign up information is provided on this After Visit Summary.  MyChart is used to connect with patients for Virtual Visits (Telemedicine).  Patients are able to view lab/test results, encounter notes, upcoming appointments, etc.  Non-urgent messages can be sent to your provider as well.   To learn more about what you can do with MyChart, go to [email or web address].    Your next appointment:   6 month(s)  The format for your next appointment:   In Person  Provider:   Jenne Campus, MD   Other Instructions
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
## 2021-07-30 ENCOUNTER — Telehealth: Payer: Self-pay | Admitting: Cardiology

## 2021-07-30 NOTE — Telephone Encounter (Signed)
Pt wants to know if Dr. Raliegh Ip would like for him to come back in for any type of testing or lab work, and if so he needs it to be done by the end of the year

## 2021-08-06 NOTE — Telephone Encounter (Signed)
Lm to return my call

## 2021-08-06 NOTE — Telephone Encounter (Signed)
Patient notified of the following per Dr. Raliegh Ip and agreed with plan. There's an order on file on 04/15/21 for lipid profile. Patient aware to be fasting.

## 2021-08-15 ENCOUNTER — Telehealth: Payer: Self-pay

## 2021-08-15 LAB — LIPID PANEL
Chol/HDL Ratio: 2.8 ratio (ref 0.0–5.0)
Cholesterol, Total: 193 mg/dL (ref 100–199)
HDL: 70 mg/dL (ref >39)
LDL Chol Calc (NIH): 108 mg/dL — ABNORMAL HIGH (ref 0–99)
Triglycerides: 83 mg/dL (ref 0–149)
VLDL Cholesterol Cal: 15 mg/dL (ref 5–40)

## 2021-08-15 NOTE — Telephone Encounter (Signed)
Left message on patients voicemail to please return our call.   

## 2021-08-15 NOTE — Telephone Encounter (Signed)
-----   Message from Park Liter, MD sent at 08/15/2021  8:48 AM EST ----- Cholesterol is still not where it supposed to be.  He is already on multiple medications.  Please make a referral to lipid clinic for potential [id number] agent

## 2021-08-19 NOTE — Addendum Note (Signed)
Addended by: Truddie Hidden on: 08/19/2021 06:22 PM   Modules accepted: Orders

## 2021-08-21 ENCOUNTER — Other Ambulatory Visit: Payer: Self-pay

## 2021-08-21 ENCOUNTER — Ambulatory Visit (INDEPENDENT_AMBULATORY_CARE_PROVIDER_SITE_OTHER): Payer: Medicare Other | Admitting: Pharmacist

## 2021-08-21 DIAGNOSIS — E785 Hyperlipidemia, unspecified: Secondary | ICD-10-CM

## 2021-08-21 NOTE — Patient Instructions (Addendum)
Call me at [phone] with any questions  Good resources:  Eat. Sleep. Move. Breath: The Beginners Guide to Living A Healthy Lifestyle  Eat, Drink, and Be Healthy: The [employer] Guide to [employer] (2007 version)  Zoe podcast   Tips for living a healthier life     Building a Healthy and Balanced Diet Make most of your meal vegetables and fruits -  of your plate. Aim for color and variety, and remember that potatoes dont count as vegetables on the Healthy Eating Plate because of their negative impact on blood sugar.  Go for whole grains -  of your plate. Whole and intact grains--whole wheat, barley, wheat berries, quinoa, oats, brown rice, and foods made with them, such as whole wheat pasta--have a milder effect on blood sugar and insulin than white bread, white rice, and other refined grains.  Protein power -  of your plate. Fish, poultry, beans, and nuts are all healthy, versatile protein sources--they can be mixed into salads, and pair well with vegetables on a plate. Limit red meat, and avoid processed meats such as bacon and sausage.  Healthy plant oils - in moderation. Choose healthy vegetable oils like olive, canola, soy, corn, sunflower, peanut, and others, and avoid partially hydrogenated oils, which contain unhealthy trans fats. Remember that low-fat does not mean healthy.  Drink water, coffee, or tea. Skip sugary drinks, limit milk and dairy products to one to two servings per day, and limit juice to a small glass per day.  Stay active. The red figure running across the [place] is a reminder that staying active is also important in weight control.  The main message of the Healthy Eating Plate is to focus on diet quality:  The type of carbohydrate in the diet is more important than the amount of carbohydrate in the diet, because some sources of carbohydrate--like vegetables (other than potatoes), fruits, whole grains, and  beans--are healthier than others. The Healthy Eating Plate also advises consumers to avoid sugary beverages, a major source of calories--usually with little nutritional value--in the American diet. The Healthy Eating Plate encourages consumers to use healthy oils, and it does not set a maximum on the percentage of calories people should get each day from healthy sources of fat. In this way, the Healthy Eating Plate recommends the opposite of the low-fat message promoted for decades by the USDA.  [employer].no  SUGAR  Sugar is a huge problem in the modern day diet. Sugar is a big contributor to heart disease, diabetes, high triglyceride levels, fatty liver disease and obesity. Sugar is hidden in almost all packaged foods/beverages. Added sugar is extra sugar that is added beyond what is naturally found and has no nutritional benefit for your body. The American Heart Association recommends limiting added sugars to no more than 25g for women and 36 grams for men per day. There are many names for sugar including maltose, sucrose (names ending in "ose"), high fructose corn syrup, molasses, cane sugar, corn sweetener, raw sugar, syrup, honey or fruit juice concentrate.   One of the best ways to limit your added sugars is to stop drinking sweetened beverages such as soda, sweet tea, and fruit juice.  There is 65g of added sugars in one 20oz bottle of Coke! That is equal to 7.5 donuts.   Pay attention and read all nutrition facts labels. Below is an examples of a nutrition facts label. The #1 is showing you the total sugars where the # 2  is showing you the added sugars. This one serving has almost the max amount of added sugars per day!     20 oz Soda 65g Sugar = 7.5 Glazed Donuts  16oz Energy  Drink 54g Sugar = 6.5 Glazed Donuts  Large Sweet  Tea 38g Sugar = 4 Glazed Donuts  20oz Sports  Drink 34g Sugar = 3.5 Glazed Donuts  8oz Chocolate  Milk 24g Sugar =2.5 Glazed Donuts  8oz Orange  Juice 21g Sugar = 2 Glazed Donuts  1 Juice Box 14g Sugar = 1.5 Glazed Donuts  16oz Water= NO SUGAR!!  EXERCISE  Exercise is good. Weve all heard that. In an ideal world, we would all have time and resources to get plenty of it. When you are active, your heart pumps more efficiently and you will feel better.  Multiple studies show that even walking regularly has benefits that include living a longer life. The American Heart Association recommends 150 minutes per week of exercise (30 minutes per day most days of the week). You can do this in any increment you wish. Nine or more 10-minute walks count. So does an hour-long exercise class. Break the time apart into what will work in your life. Some of the best things you can do include walking briskly, jogging, cycling or swimming laps. Not everyone is ready to exercise. Sometimes we need to start with just getting active. Here are some easy ways to be more active throughout the day:  Take the stairs instead of the elevator  Go for a 10-15 minute walk during your lunch break (find a friend to make it more enjoyable)  When shopping, park at the back of the parking lot  If you take public transportation, get off one stop early and walk the extra distance  Pace around while making phone calls  Check with your doctor if you arent sure what your limitations may be. Always remember to drink plenty of water when doing any type of exercise. Dont feel like a failure if youre not getting the 90-150 minutes per week. If you started by being a couch potato, then just a 10-minute walk each day is a huge improvement. Start with little victories and work your way up.   HEALTHY EATING TIPS  When looking to improve your eating habits, whether to lose weight, lower blood pressure or just be healthier, it helps to know what a serving size is.   Grains 1 slice of bread,  bagel,  cup pasta or  rice  Vegetables 1 cup fresh or raw vegetables,  cup cooked or canned Fruits 1 piece of medium sized fruit,  cup canned,   Meats/Proteins  cup dried       1 oz meat, 1 egg,  cup cooked beans, nuts or seeds  Dairy        Fats Individual yogurt container, 1 cup (8oz)    1 teaspoon margarine/butter or vegetable  milk or milk alternative, 1 slice of cheese          oil; 1 tablespoon mayonnaise or salad dressing                  Plan ahead: make a menu of the meals for a week then create a grocery list to go with that menu. Consider meals that easily stretch into a night of leftovers, such as stews or casseroles. Or consider making two of your favorite meal and put one in the freezer for another night. Try a night or  two each week that is meatless or no cook such as salads. When you get home from the grocery store wash and prepare your vegetables and fruits. Then when you need them they are ready to go.   Tips for going to the grocery store:  [employer] store or generic brands  Check the weekly ad from your store on-line or in their in-store flyer  Look at the unit price on the shelf tag to compare/contrast the costs of different items  Buy fruits/vegetables in season  Carrots, bananas and apples are low-cost, naturally healthy items  If meats or frozen vegetables are on sale, buy some extras and put in your freezer  Limit buying prepared or ready to eat items, even if they are pre-made salads or fruit snacks  Do not shop when youre hungry  Foods at eye level tend to be more expensive. Look on the high and low shelves for deals.  Consider shopping at the farmers market for fresh foods in season.  Avoid the cookie and chip aisles (these are expensive, high in calories and low in nutritional value). Shop on the outside of the grocery store.  Healthy food preparations:  If you cant get lean hamburger, be sure to drain the fat when cooking  Steam, saut (in olive oil), grill or bake foods   Experiment with different seasonings to avoid adding salt to your foods. Kosher salt, sea salt and Himalayan salt are all still salt and should be avoided. Try seasoning food with onion, garlic, thyme, rosemary, basil ect. Onion powder or garlic powder is ok. Avoid if it says salt (ie garlic salt).

## 2021-08-21 NOTE — Progress Notes (Signed)
Patient ID: Darrell Williams                 DOB: May 13, 1946                    MRN: [id number]     HPI: Darrell Williams is a 75 y.o. male patient referred to lipid clinic by Dr. Agustin Cree. PMH is significant for peripheral vascular disease, he does have up to 70% stenosis of both carotic arteries, coronary artery disease coronary CT angiogram in 2011 showing 25% LAD.   Patient presents to lipid clinic today. He has a lot of good questions and takes notes of the appointment. He states that his diet and physical activity could be better. He is not a good cook and lives alone. He does not exercise. He says he could walk a mile or two every few days but needs to find the motivation to do it. Confirms he is taking rosuvastatin and zetia. Is planning to switch from a part F and D plan to an advantange plan next year. Has until tomorrow to change his mind.  Current Medications: rosuvastatin 40mg , ezetimibe 10mg  daily Risk Factors: HTN, PVD, carotid artery disease, CAD LDL goal: <70  Diet: not a very good cook, lives alone  Exercise: walks to his shop and back but its not all that far- needs to do more  Family History: The patient's family history includes Diabetes (age of onset: 24) in his paternal uncle; Hypertension in his father and mother; Stroke in his father.  Social History:   Labs:08/14/21: TC 193, TG 83, HDL 70, LDL 108 (rosuvastatin 40mg , ezetimibe 10mg  daily?)  Past Medical History:  Diagnosis Date   Abdominal pain 01/07/2019   Acute pain of right shoulder 09/27/2018   2020   Allergic rhinitis 05/06/2016   At risk for sleep apnea    STOP-BANG= 4    SENT TO PCP 07-16-2014   Bilateral carotid artery stenosis    Bilateral ICA  60-79%  per last duplex 02-21-2014   Bilateral lower extremity edema 04/14/2019   2020   Bilateral ureteral calculi    BPH (benign prostatic hypertrophy)    Carbuncle 12/01/2019   Formatting of this note might be different from the original. 2021: left  buttocks 2021: left buttocks, recurrent   Carotid stenosis 07/25/2009   Qualifier: Diagnosis of  By: Al-Rammal, RVT, RDCS, Missy     CHEST PAIN, PRECORDIAL 04/14/2010   Qualifier: Diagnosis of  By: Haroldine Laws, MD, Eileen Stanford    Coronary atherosclerosis of native coronary artery    cardiologist-  dr bensinhom--  pLAD 25%  per CT scan   Cough variant asthma 05/29/2016   See pfts 05/13/16  Ratios are nl but there is curvature to f/v on no rx and s gerd rx which was started 05/29/2016    Dizziness 04/26/2018   2019   Dyspnea on exertion 01/16/2009   Qualifier: Diagnosis of  By: Haroldine Laws, MD, Eileen Stanford    Elevated PSA 07/21/2016   Overview:  Managed UROL   Frequency of urination    Glaucoma 05/06/2016   Glaucoma of both eyes    Helicobacter pylori (H. 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 2021: back   Swelling of both lower extremities 05/18/2019   Unilateral recurrent inguinal hernia without obstruction or gangrene 10/30/2018   1980: [facility] 2020: recurrent right   Upper airway cough syndrome 05/29/2016   PFT's 05/13/16 FEV1  1.53 (44%) ratio 72 with severely truncated insp loop and mildly concave curvature to expiratory portion Max GERD 05/29/2016 >>>  - FENO 05/29/2016  =   19  - Allergy  profile 05/29/2016 >  Eos 0.2 /  IgE  170 neg RAST  - Spirometry 07/14/2016  FEV1 1.81 (53%)  Ratio 80 with no curvature at all  > try singulair x 30 days and then ent eval if not better  - Eval by Dr Joya Gaskins 08/13/16   Ureteral calculi 07/23/2014   Urgency of urination    Wart 05/06/2016   Overview:  2017: RUE   Wears glasses    Wellness examination 05/20/2016    Current Outpatient Medications on File Prior to Visit  Medication Sig Dispense Refill   amLODipine (NORVASC) 5 MG tablet Take 1 tablet (5 mg total) by mouth daily. 90 tablet 3   aspirin EC 81 MG tablet Take 81 mg by mouth daily.     b complex vitamins capsule Take 1 capsule by mouth daily. Unknown strenght     ezetimibe (ZETIA) 10 MG tablet Take 1 tablet (10 mg total) by mouth daily. 90 tablet 3   famotidine (PEPCID) 20 MG tablet TAKE 1 TABLET BY MOUTH TWO  TIMES DAILY (Patient taking differently: Take 20 mg by mouth as needed for heartburn or indigestion.) 90 tablet 3   fluticasone (FLONASE) 50 MCG/ACT nasal spray Place 2 sprays into both nostrils as needed for allergies or rhinitis.     furosemide (LASIX) 40 MG tablet Take 1 tablet (40 mg total) by mouth as needed for fluid or edema. 30 tablet 3   gabapentin (NEURONTIN) 100 MG capsule Take 100 mg by mouth as needed (nerve pain).     KLOR-CON M20 20 MEQ tablet TAKE 2 TABLETS BY MOUTH IN  THE MORNING AND 1 TABLET BY MOUTH AT NIGHT (Patient taking differently: Take 40 mEq by mouth 2 (two) times daily. TAKE 2 TABLETS BY MOUTH IN  THE MORNING AND 1 TABLET BY MOUTH AT NIGHT) 270 tablet 3   latanoprost (XALATAN) 0.005 % ophthalmic solution Place 1 drop into both eyes at bedtime.     loratadine (CLARITIN) 10 MG tablet Take 10 mg by mouth daily as needed for allergies.     Melatonin 5 MG CAPS Take 1 capsule by mouth at bedtime.     Multiple Vitamins-Minerals (MULTIVITAMIN WITH MINERALS) tablet Take 1 tablet by mouth daily. Unknown strength     Omega-3 1000 MG CAPS Take 1 capsule by mouth  daily.     pantoprazole (PROTONIX) 40 MG tablet Take 1 tablet by mouth daily.     rosuvastatin (CRESTOR) 40 MG tablet TAKE 1 TABLET BY MOUTH  DAILY (Patient taking differently: Take 40 mg by mouth daily.)  90 tablet 3   tamsulosin (FLOMAX) 0.4 MG CAPS capsule Take 0.4 mg by mouth in the morning and at bedtime.      timolol (TIMOPTIC) 0.5 % ophthalmic solution Place 1 drop into both eyes 2 (two) times daily.     VITAMIN D, CHOLECALCIFEROL, PO Take 1 tablet by mouth daily. Unknown strength     vitamin E 400 UNIT capsule Take 400 Units by mouth daily.     No current facility-administered medications on file prior to visit.    Allergies  Allergen Reactions   Ace Inhibitors Other (See Comments)    : cough   Codeine Nausea And Vomiting    Assessment/Plan:  1. Hyperlipidemia - LDL-C is above goal. We discussed the risk reduction seen with lowering of LDL-C. Also discussed better risk markers, ApoB, LDL-P, LPa. Regardless, my recommendation is to add PCSK9i. I think we can stop Zetia. Continue rosuvastatin. We discussed cost and the possibility of getting a [employer] grant. We also talk about the coverage gap and applying for low income subsidy. Patient wishes to call insurance and discuss. He has my number to call back when he decides and if he has any questions. I provided him resources for good nutritional education.    Thank you,   Ramond Dial, Pharm.D, BCPS, CPP [facility]  [address] [place], [place] [address]  Phone: [phone]; Fax: [phone]

## 2021-08-22 ENCOUNTER — Telehealth: Payer: Self-pay | Admitting: Cardiology

## 2021-08-22 NOTE — Telephone Encounter (Signed)
Pt is very concerned with his lipids and if the benefits he would receive from [employer] [place] are worth $4000.00 per year. He is concerned that the results will not be that great. Pt states would he be able to maintain optimal LDL with diet and exercise rather than Repatha. Would Nexelatol or Nexelizet be an option as it reduces  LDL better in conjunction with a statin. Pt would like a call to discuss further from [facility] once you have time to review his recent labs.

## 2021-08-22 NOTE — Telephone Encounter (Signed)
Upon your return, please address.

## 2021-08-22 NOTE — Telephone Encounter (Signed)
Pt c/o medication issue:  1. Name of Medication:  Repatha  2. How are you currently taking this medication (dosage and times per day)?   3. Are you having a reaction (difficulty breathing--STAT)?   4. What is your medication issue?   Patient is requesting to speak with Dr. Wendy Poet nurse. He states he has concerns with Repatha and his lab work. States he will discuss further when he speaks with the nurse.

## 2021-08-26 NOTE — Telephone Encounter (Signed)
Spoke to patient. Informed him of Dr. Wendy Poet note. He is still looking into nexletol and will be discussing with pharmacist. He will let us know what he decides. No further questions at this time.

## 2021-09-29 ENCOUNTER — Other Ambulatory Visit: Payer: Self-pay | Admitting: Cardiology

## 2021-12-03 ENCOUNTER — Ambulatory Visit: Payer: Medicare Other | Admitting: Cardiology

## 2021-12-11 ENCOUNTER — Telehealth: Payer: Self-pay | Admitting: Pharmacist

## 2021-12-11 NOTE — Telephone Encounter (Signed)
Patient called as a follow up from his Dec visit. He first said he was ready to do Repatha but then started asking more questions about medicare, grants, covergap ect. I explained and answered all his questions. ?I did encourage him to apply for LIS ?Also asked about travel- advised that pen is good for 30 day at room temperature.  ?Patient will call me back with his decision. ?

## 2021-12-17 ENCOUNTER — Ambulatory Visit: Payer: Medicare Other | Admitting: Cardiology

## 2021-12-17 ENCOUNTER — Encounter: Payer: Self-pay | Admitting: Cardiology

## 2021-12-17 VITALS — BP 136/84 | HR 74 | Ht 72.0 in | Wt 178.0 lb

## 2021-12-17 DIAGNOSIS — E782 Mixed hyperlipidemia: Secondary | ICD-10-CM

## 2021-12-17 DIAGNOSIS — I10 Essential (primary) hypertension: Secondary | ICD-10-CM | POA: Diagnosis not present

## 2021-12-17 DIAGNOSIS — C642 Malignant neoplasm of left kidney, except renal pelvis: Secondary | ICD-10-CM | POA: Diagnosis not present

## 2021-12-17 DIAGNOSIS — R7303 Prediabetes: Secondary | ICD-10-CM

## 2021-12-17 DIAGNOSIS — I6523 Occlusion and stenosis of bilateral carotid arteries: Secondary | ICD-10-CM

## 2021-12-17 DIAGNOSIS — I2510 Atherosclerotic heart disease of native coronary artery without angina pectoris: Secondary | ICD-10-CM | POA: Diagnosis not present

## 2021-12-17 MED ORDER — KLOR-CON M20 20 MEQ PO TBCR: 40.0000 meq | EXTENDED_RELEASE_TABLET | Freq: Two times a day (BID) | ORAL | 3 refills | Status: DC

## 2021-12-17 MED ORDER — AMLODIPINE BESYLATE 5 MG PO TABS: 5.0000 mg | ORAL_TABLET | Freq: Every day | ORAL | 11 refills | Status: DC

## 2021-12-17 MED ORDER — ROSUVASTATIN CALCIUM 40 MG PO TABS: 40.0000 mg | ORAL_TABLET | Freq: Every day | ORAL | 3 refills | Status: DC
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
## 2021-12-17 NOTE — Patient Instructions (Addendum)
Medication Instructions:  ? ?*If you need a refill on your cardiac medications before your next appointment, please call your pharmacy* ? ? ?Lab Work: ?CMP Today ?If you have labs (blood work) drawn today and your tests are completely normal, you will receive your results only by: ?MyChart Message (if you have MyChart) OR ?A paper copy in the mail ?If you have any lab test that is abnormal or we need to change your treatment, we will call you to review the results. ? ? ?Testing/Procedures: ?None ordered ? ? ?Follow-Up: ?At [facility], you and your health needs are our priority.  As part of our continuing mission to provide you with exceptional heart care, we have created designated Provider Care Teams.  These Care Teams include your primary Cardiologist (physician) and Advanced Practice Providers (APPs -  Physician Assistants and Nurse Practitioners) who all work together to provide you with the care you need, when you need it. ? ?We recommend signing up for the patient portal called "MyChart".  Sign up information is provided on this After Visit Summary.  MyChart is used to connect with patients for Virtual Visits (Telemedicine).  Patients are able to view lab/test results, encounter notes, upcoming appointments, etc.  Non-urgent messages can be sent to your provider as well.   ?To learn more about what you can do with MyChart, go to [email or web address].   ? ?Your next appointment:   ?5 month(s) ? ?The format for your next appointment:   ?In Person ? ?Provider:   ?Jenne Campus, MD  ? ? ?Other Instructions ?Refilled Rosuvastatin, Potassium, Amlodipine ? ?Important Information About Sugar ? ? ? ? ?  ?

## 2021-12-18 LAB — COMPREHENSIVE METABOLIC PANEL
ALT: 53 IU/L — ABNORMAL HIGH (ref 0–44)
AST: 43 IU/L — ABNORMAL HIGH (ref 0–40)
Albumin/Globulin Ratio: 1.8 (ref 1.2–2.2)
Albumin: 4.2 g/dL (ref 3.7–4.7)
Alkaline Phosphatase: 71 IU/L (ref 44–121)
BUN/Creatinine Ratio: 13 (ref 10–24)
BUN: 21 mg/dL (ref 8–27)
Bilirubin Total: 1 mg/dL (ref 0.0–1.2)
CO2: 26 mmol/L (ref 20–29)
Calcium: 9.1 mg/dL (ref 8.6–10.2)
Chloride: 107 mmol/L — ABNORMAL HIGH (ref 96–106)
Creatinine, Ser: 1.61 mg/dL — ABNORMAL HIGH (ref 0.76–1.27)
Globulin, Total: 2.4 g/dL (ref 1.5–4.5)
Glucose: 102 mg/dL — ABNORMAL HIGH (ref 70–99)
Potassium: 4.8 mmol/L (ref 3.5–5.2)
Sodium: 145 mmol/L — ABNORMAL HIGH (ref 134–144)
Total Protein: 6.6 g/dL (ref 6.0–8.5)
eGFR: 44 mL/min/{1.73_m2} — ABNORMAL LOW (ref >59)

## 2021-12-19 ENCOUNTER — Telehealth: Payer: Self-pay | Admitting: Cardiology

## 2021-12-19 ENCOUNTER — Other Ambulatory Visit: Payer: Self-pay

## 2021-12-19 MED ORDER — AMLODIPINE BESYLATE 5 MG PO TABS: 5.0000 mg | ORAL_TABLET | Freq: Every day | ORAL | 3 refills | Status: DC

## 2021-12-19 NOTE — Telephone Encounter (Signed)
?*  STAT* If patient is at the pharmacy, call can be transferred to refill team. ? ? ?1. Which medications need to be refilled? (please list name of each medication and dose if known) Amlodipine ? ?2. Which pharmacy/location (including street and city if local pharmacy) is medication to be sent to?[facility] ? ?3. Do they need a 30 day or 90 day supply? 90 days and refills- need this call in today ? ? ?Patient said he would like to a nurse today please, concerning this mix up in with his Amlodipine ?

## 2021-12-22 ENCOUNTER — Telehealth: Payer: Self-pay | Admitting: Cardiology

## 2021-12-22 MED ORDER — ROSUVASTATIN CALCIUM 40 MG PO TABS: 40.0000 mg | ORAL_TABLET | Freq: Every day | ORAL | 3 refills | Status: DC

## 2021-12-22 MED ORDER — EZETIMIBE 10 MG PO TABS: 10.0000 mg | ORAL_TABLET | Freq: Every day | ORAL | 3 refills | Status: DC

## 2021-12-22 MED ORDER — AMLODIPINE BESYLATE 10 MG PO TABS
5.0000 mg | ORAL_TABLET | Freq: Two times a day (BID) | ORAL | 3 refills | Status: DC
Start: 2021-12-22 — End: 2022-03-02

## 2021-12-22 MED ORDER — KLOR-CON M20 20 MEQ PO TBCR
EXTENDED_RELEASE_TABLET | ORAL | 3 refills | Status: DC
Start: 2021-12-22 — End: 2022-12-25

## 2021-12-22 NOTE — Telephone Encounter (Signed)
?  Pt c/o medication issue: ? ?1. Name of Medication: amLODipine (NORVASC) 5 MG tablet and KLOR-CON M20 20 MEQ tablet ? ?2. How are you currently taking this medication (dosage and times per day)? Amlodipine '10mg'$  taken 0.5 tablet 2x daily // Klor-Con M20 3x daily ? ?3. Are you having a reaction (difficulty breathing--STAT)? no ? ?4. What is your medication issue? Patient states that these medications were sent in last week incorrectly. Above is how the patient is supposed to be currently taking them. Patient is on 90 day refills and refills needs to go to  [facility] ([place], [place]  ? ? ? ?Patient is very frustrated with this mess up and the phone system. I advised I would send as high priority to triage since he called in last week regarding these refills.  ?

## 2021-12-22 NOTE — Telephone Encounter (Signed)
Refills sent as requested

## 2022-03-02 ENCOUNTER — Telehealth: Payer: Self-pay | Admitting: Cardiology

## 2022-03-02 MED ORDER — AMLODIPINE BESYLATE 5 MG PO TABS
5.0000 mg | ORAL_TABLET | Freq: Two times a day (BID) | ORAL | 3 refills | Status: DC
Start: 2022-03-02 — End: 2023-05-20

## 2022-03-02 NOTE — Telephone Encounter (Signed)
Pt c/o medication issue:  1. Name of Medication:  amLODipine (NORVASC) 10 MG tablet   2. How are you currently taking this medication (dosage and times per day)?   As prescribed  3. Are you having a reaction (difficulty breathing--STAT)? No  4. What is your medication issue?    Patient called wanting to get a '5mg'$  pill prescription to be taken twice a day instead of  the current 10 mg pill which crumbles when he has to cut it in half.   New prescription should be sent to [facility].

## 2022-03-02 NOTE — Telephone Encounter (Signed)
Spoke with the patient and advised that new prescription has been sent in.

## 2022-03-04 ENCOUNTER — Other Ambulatory Visit: Payer: Self-pay | Admitting: Cardiology

## 2022-03-25 ENCOUNTER — Other Ambulatory Visit: Payer: Self-pay

## 2022-03-25 MED ORDER — FUROSEMIDE 40 MG PO TABS: 40.0000 mg | ORAL_TABLET | ORAL | 3 refills | Status: AC | PRN

## 2022-04-13 ENCOUNTER — Telehealth: Payer: Self-pay | Admitting: Cardiology

## 2022-04-13 NOTE — Telephone Encounter (Signed)
Pt c/o medication issue:  1. Name of Medication: furosemide (LASIX) 40 MG tablet  2. How are you currently taking this medication (dosage and times per day)? N/A  3. Are you having a reaction (difficulty breathing--STAT)? N/A  4. What is your medication issue? Needing to confirm if patient would ever take more than one tablet due to having to list a frequency for billing purposes before distributing

## 2022-04-13 NOTE — Telephone Encounter (Signed)
Advised that the pt is to take 1 tablet daily as needed for fluid or edema.

## 2022-05-20 ENCOUNTER — Ambulatory Visit: Payer: Medicare Other | Attending: Cardiology | Admitting: Cardiology

## 2022-05-20 ENCOUNTER — Encounter: Payer: Self-pay | Admitting: Cardiology

## 2022-05-20 VITALS — BP 110/70 | HR 64 | Ht 72.0 in | Wt 179.0 lb

## 2022-05-20 DIAGNOSIS — I2510 Atherosclerotic heart disease of native coronary artery without angina pectoris: Secondary | ICD-10-CM | POA: Diagnosis not present

## 2022-05-20 DIAGNOSIS — I10 Essential (primary) hypertension: Secondary | ICD-10-CM

## 2022-05-20 DIAGNOSIS — E782 Mixed hyperlipidemia: Secondary | ICD-10-CM

## 2022-05-20 DIAGNOSIS — I6523 Occlusion and stenosis of bilateral carotid arteries: Secondary | ICD-10-CM
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
## 2022-05-20 NOTE — Addendum Note (Signed)
Addended by: Darrel Reach on: 05/20/2022 05:04 PM   Modules accepted: Orders

## 2022-05-20 NOTE — Patient Instructions (Signed)
Medication Instructions:  Your physician recommends that you continue on your current medications as directed. Please refer to the Current Medication list given to you today.  *If you need a refill on your cardiac medications before your next appointment, please call your pharmacy*   Lab Work: Your physician recommends that you return for lab work in: [place] today If you have labs (blood work) drawn today and your tests are completely normal, you will receive your results only by: [place] (if you have MyChart) OR A paper copy in the mail If you have any lab test that is abnormal or we need to change your treatment, we will call you to review the results.   Testing/Procedures: None   Follow-Up: At [facility], you and your health needs are our priority.  As part of our continuing mission to provide you with exceptional heart care, we have created designated Provider Care Teams.  These Care Teams include your primary Cardiologist (physician) and Advanced Practice Providers (APPs -  Physician Assistants and Nurse Practitioners) who all work together to provide you with the care you need, when you need it.  We recommend signing up for the patient portal called "MyChart".  Sign up information is provided on this After Visit Summary.  MyChart is used to connect with patients for Virtual Visits (Telemedicine).  Patients are able to view lab/test results, encounter notes, upcoming appointments, etc.  Non-urgent messages can be sent to your provider as well.   To learn more about what you can do with MyChart, go to [email or web address].    Your next appointment:   1 year(s)  The format for your next appointment:   In Person  Provider:   Jenne Campus, MD    Other Instructions   Important Information About Sugar

## 2022-05-21 LAB — LIPID PANEL
Chol/HDL Ratio: 2.5 ratio (ref 0.0–5.0)
Cholesterol, Total: 162 mg/dL (ref 100–199)
HDL: 65 mg/dL (ref >39)
LDL Chol Calc (NIH): 81 mg/dL (ref 0–99)
Triglycerides: 87 mg/dL (ref 0–149)
VLDL Cholesterol Cal: 16 mg/dL (ref 5–40)

## 2022-05-28 ENCOUNTER — Telehealth: Payer: Self-pay | Admitting: Cardiology

## 2022-05-28 ENCOUNTER — Telehealth: Payer: Self-pay

## 2022-05-28 NOTE — Telephone Encounter (Signed)
Patient is returning call to discuss lab results. 

## 2022-05-28 NOTE — Telephone Encounter (Signed)
Results reviewed with pt as per Dr. Krasowski's note.  Pt verbalized understanding and had no additional questions. Routed to PCP  

## 2022-12-25 ENCOUNTER — Other Ambulatory Visit: Payer: Self-pay | Admitting: Cardiology

## 2023-01-04 DIAGNOSIS — B354 Tinea corporis: Secondary | ICD-10-CM | POA: Insufficient documentation

## 2023-01-26 DIAGNOSIS — L821 Other seborrheic keratosis: Secondary | ICD-10-CM | POA: Insufficient documentation

## 2023-02-25 ENCOUNTER — Other Ambulatory Visit: Payer: Self-pay | Admitting: Cardiology

## 2023-04-01 ENCOUNTER — Other Ambulatory Visit: Payer: Self-pay

## 2023-04-01 ENCOUNTER — Emergency Department (HOSPITAL_COMMUNITY)
Admission: EM | Admit: 2023-04-01 | Discharge: 2023-04-02 | Disposition: A | Discharge status: Home / Self Care | Payer: Medicare Other | Attending: Emergency Medicine | Admitting: Emergency Medicine

## 2023-04-01 ENCOUNTER — Encounter (HOSPITAL_COMMUNITY): Payer: Self-pay | Admitting: Emergency Medicine

## 2023-04-01 DIAGNOSIS — Z79899 Other long term (current) drug therapy: Secondary | ICD-10-CM | POA: Insufficient documentation

## 2023-04-01 DIAGNOSIS — J45909 Unspecified asthma, uncomplicated: Secondary | ICD-10-CM | POA: Insufficient documentation

## 2023-04-01 DIAGNOSIS — Z7951 Long term (current) use of inhaled steroids: Secondary | ICD-10-CM | POA: Diagnosis not present

## 2023-04-01 DIAGNOSIS — Z7982 Long term (current) use of aspirin: Secondary | ICD-10-CM | POA: Insufficient documentation

## 2023-04-01 DIAGNOSIS — R1084 Generalized abdominal pain: Secondary | ICD-10-CM | POA: Diagnosis not present

## 2023-04-01 DIAGNOSIS — I10 Essential (primary) hypertension: Secondary | ICD-10-CM | POA: Insufficient documentation

## 2023-04-01 DIAGNOSIS — R7989 Other specified abnormal findings of blood chemistry: Secondary | ICD-10-CM | POA: Diagnosis not present

## 2023-04-01 DIAGNOSIS — R109 Unspecified abdominal pain: Secondary | ICD-10-CM | POA: Diagnosis present

## 2023-04-01 LAB — CBC WITH DIFFERENTIAL/PLATELET
Abs Immature Granulocytes: 0.02 10*3/uL (ref 0.00–0.07)
Basophils Absolute: 0 10*3/uL (ref 0.0–0.1)
Basophils Relative: 0 %
Eosinophils Absolute: 0 10*3/uL (ref 0.0–0.5)
Eosinophils Relative: 0 %
HCT: 44.7 % (ref 39.0–52.0)
Hemoglobin: 14.8 g/dL (ref 13.0–17.0)
Immature Granulocytes: 0 %
Lymphocytes Relative: 16 %
Lymphs Abs: 1 10*3/uL (ref 0.7–4.0)
MCH: 30.5 pg (ref 26.0–34.0)
MCHC: 33.1 g/dL (ref 30.0–36.0)
MCV: 92.2 fL (ref 80.0–100.0)
Monocytes Absolute: 0.6 10*3/uL (ref 0.1–1.0)
Monocytes Relative: 10 %
Neutro Abs: 4.7 10*3/uL (ref 1.7–7.7)
Neutrophils Relative %: 74 %
Platelets: 193 10*3/uL (ref 150–400)
RBC: 4.85 MIL/uL (ref 4.22–5.81)
RDW: 13.3 % (ref 11.5–15.5)
WBC: 6.4 10*3/uL (ref 4.0–10.5)
nRBC: 0 % (ref 0.0–0.2)

## 2023-04-01 NOTE — ED Triage Notes (Addendum)
Patient BIB [facility] c/o upper abdominal pain x 3 days.  Patient has history of incarcerated hernia involving diaphragm.  Patient also endorses nausea.   20 g l hand  150/90

## 2023-04-01 NOTE — ED Provider Notes (Signed)
[place] EMERGENCY DEPARTMENT AT [facility] Provider Note   CSN: [id number] Arrival date & time: 04/01/23  2316     History  Chief Complaint  Patient presents with   Abdominal Pain    ESTANISLADO SCORE is a 77 y.o. male history of BPH, hypertension, hyperlipidemia, prediabetes presents today for evaluation of chest pain and abdominal pain.  Patient states that he started to have chest pain and abdominal pain 2 days ago.  Pain is located in the epigastrium radiating to his left-sided chest, left upper quadrant and left lower quadrant.  He reports last bowel movement was 3 hours ago with normal stool.  States the pain is intermittent, worse when it first started then waxing and waning.  He denies any neck pain, back pain, arm and leg weakness or numbness.  He denies fever.  He endorses nausea without vomiting. History of large hiatal hernia s/p fundoplication in 2020.   Abdominal Pain     Past Medical History:  Diagnosis Date   Abdominal pain 01/07/2019   Acute pain of right shoulder 09/27/2018   2020   Allergic rhinitis 05/06/2016   At risk for sleep apnea    STOP-BANG= 4    SENT TO PCP 07-16-2014   Bilateral carotid artery stenosis    Bilateral ICA  60-79%  per last duplex 02-21-2014   Bilateral lower extremity edema 04/14/2019   2020   Bilateral ureteral calculi    BPH (benign prostatic hypertrophy)    Carbuncle 12/01/2019   Formatting of this note might be different from the original. 2021: left buttocks 2021: left buttocks, recurrent   Carotid stenosis 07/25/2009   Qualifier: Diagnosis of  By: Al-Rammal, RVT, RDCS, Missy     CHEST PAIN, PRECORDIAL 04/14/2010   Qualifier: Diagnosis of  By: Gala Romney, MD, Trixie Dredge    Coronary atherosclerosis of native coronary artery    cardiologist-  dr bensinhom--  pLAD 25%  per CT scan   Cough variant asthma 05/29/2016   See pfts 05/13/16  Ratios are nl but there is curvature to f/v on no rx and s gerd rx which was started  05/29/2016    Dizziness 04/26/2018   2019   Dyspnea on exertion 01/16/2009   Qualifier: Diagnosis of  By: Gala Romney, MD, Trixie Dredge    Elevated PSA 07/21/2016   Overview:  Managed UROL   Frequency of urination    Glaucoma 05/06/2016   Glaucoma of both eyes    Helicobacter pylori (H. pylori) infection 01/20/2019   2020: 4 drug treatment, [facility]   Hoarseness 09/16/2020   Hyperlipidemia    Hyperlipidemia with target LDL less than 70 01/16/2009   Qualifier: Diagnosis of  By: Susette Racer CMA, Jewel     Hypertension    HYPERTENSION, BENIGN 01/16/2009   Qualifier: Diagnosis of  By: Susette Racer CMA, Jewel     HYPOKALEMIA 01/16/2009   Qualifier: Diagnosis of  By: Gala Romney, MD, Trixie Dredge    Nephrolithiasis 07/23/2014   Paraesophageal hiatal hernia 01/18/2019   2020: prolonged hosp with colon herniation, poor intake underwent lap HHR with gastropexy   Plaque    cholesterol plaque--on his retina exam   Prediabetes 08/02/2018   2019: cards labs: x/6.2   Sebaceous cyst 02/13/2020   Formatting of this note might be different from the original. 2021: back   Swelling of both lower extremities 05/18/2019   Unilateral recurrent inguinal hernia without obstruction or gangrene 10/30/2018   1980: [facility] 2020: recurrent right  Upper airway cough syndrome 05/29/2016   PFT's 05/13/16 FEV1  1.53 (44%) ratio 72 with severely truncated insp loop and mildly concave curvature to expiratory portion Max GERD 05/29/2016 >>>  - FENO 05/29/2016  =   19  - Allergy profile 05/29/2016 >  Eos 0.2 /  IgE  170 neg RAST  - Spirometry 07/14/2016  FEV1 1.81 (53%)  Ratio 80 with no curvature at all  > try singulair x 30 days and then ent eval if not better  - Eval by Dr Delford Field 08/13/16   Ureteral calculi 07/23/2014   Urgency of urination    Wart 05/06/2016   Overview:  2017: RUE   Wears glasses    Wellness examination 05/20/2016   Past Surgical History:  Procedure Laterality Date   CARDIOVASCULAR STRESS TEST  08-12-2005  dr Myrtis Ser   normal  nuclear study/  no ischemia /  ef 67%---  11/ 2009  normal ETT   CATARACT EXTRACTION W/ INTRAOCULAR LENS  IMPLANT, BILATERAL     COLONOSCOPY  2005 (approx)   CYSTOSCOPY WITH RETROGRADE PYELOGRAM, URETEROSCOPY AND STENT PLACEMENT Bilateral 07/23/2014   Procedure: CYSTOSCOPY WITH BILATERAL RETROGRADE PYELOGRAM, BILATERAL URETEROSCOPY AND STENT PLACEMENT, STONE EXTRACTION;  Surgeon: Valetta Fuller, MD;  Location: [facility];  Service: Urology;  Laterality: Bilateral;   HOLMIUM LASER APPLICATION Bilateral 07/23/2014   Procedure: HOLMIUM LASER APPLICATION;  Surgeon: Valetta Fuller, MD;  Location: [facility];  Service: Urology;  Laterality: Bilateral;   INGUINAL HERNIA REPAIR Bilateral 1995  (approx)  ]   Home Medications Prior to Admission medications   Medication Sig Start Date End Date Taking? Authorizing Provider  amLODipine (NORVASC) 5 MG tablet Take 1 tablet (5 mg total) by mouth 2 (two) times daily. 03/02/22   Georgeanna Lea, MD  aspirin EC 81 MG tablet Take 81 mg by mouth daily.    [provider]  b complex vitamins capsule Take 1 capsule by mouth daily. Unknown strenght    [provider]  ezetimibe (ZETIA) 10 MG tablet TAKE 1 TABLET BY MOUTH DAILY 02/26/23   Georgeanna Lea, MD  famotidine (PEPCID) 20 MG tablet TAKE 1 TABLET BY MOUTH TWO  TIMES DAILY Patient taking differently: Take 20 mg by mouth as needed for heartburn or indigestion. 06/16/16   Nyoka Cowden, MD  fluticasone (FLONASE) 50 MCG/ACT nasal spray Place 2 sprays into both nostrils as needed for allergies or rhinitis.    [provider]  furosemide (LASIX) 40 MG tablet Take 1 tablet (40 mg total) by mouth as needed for fluid or edema. 03/25/22   Georgeanna Lea, MD  gabapentin (NEURONTIN) 100 MG capsule Take 100 mg by mouth as needed (nerve pain). 06/30/19   [provider]  KLOR-CON M20 20 MEQ tablet Take 2 tablets (40 mEq total) by mouth every  morning AND 1 tablet (20 mEq total) every evening. 12/25/22   Georgeanna Lea, MD  latanoprost (XALATAN) 0.005 % ophthalmic solution Place 1 drop into both eyes at bedtime. 10/23/19   [provider]  loratadine (CLARITIN) 10 MG tablet Take 10 mg by mouth daily as needed for allergies.    [provider]  Melatonin 5 MG CAPS Take 1 capsule by mouth at bedtime.    [provider]  Multiple Vitamins-Minerals (MULTIVITAMIN WITH MINERALS) tablet Take 1 tablet by mouth daily. Unknown strength    [provider]  Omega-3 1000 MG CAPS Take 1 capsule by mouth  daily.    [provider]  pantoprazole (PROTONIX) 40 MG tablet Take 1 tablet by mouth daily. 10/16/20   [provider]  rosuvastatin (CRESTOR) 40 MG tablet Take 1 tablet (40 mg total) by mouth daily. 12/25/22   Georgeanna Lea, MD  tamsulosin (FLOMAX) 0.4 MG CAPS capsule Take 0.4 mg by mouth in the morning and at bedtime.     [provider]  timolol (TIMOPTIC) 0.5 % ophthalmic solution Place 1 drop into both eyes 2 (two) times daily.    [provider]  VITAMIN D, CHOLECALCIFEROL, PO Take 1 tablet by mouth daily. Unknown strength    [provider]  vitamin E 400 UNIT capsule Take 400 Units by mouth daily.    [provider]      Allergies    Ace inhibitors and Codeine    Review of Systems   Review of Systems  Gastrointestinal:  Positive for abdominal pain.    Physical Exam Updated Vital Signs Temp 99.1 F (37.3 C) (Oral)   Ht 6' (1.829 m)   Wt 81.6 kg   BMI 24.41 kg/m  Physical Exam Vitals and nursing note reviewed.  Constitutional:      Appearance: Normal appearance.  HENT:     Head: Normocephalic and atraumatic.     Mouth/Throat:     Mouth: Mucous membranes are moist.  Eyes:     General: No scleral icterus. Cardiovascular:     Rate and Rhythm: Normal rate and regular rhythm.     Pulses: Normal pulses.     Heart sounds: Normal heart  sounds.  Pulmonary:     Effort: Pulmonary effort is normal.     Breath sounds: Normal breath sounds.  Abdominal:     General: Abdomen is flat.     Palpations: Abdomen is soft.     Tenderness: There is abdominal tenderness in the epigastric area, left upper quadrant and left lower quadrant.  Musculoskeletal:        General: No deformity.  Skin:    General: Skin is warm.     Findings: No rash.  Neurological:     General: No focal deficit present.     Mental Status: He is alert.  Psychiatric:        Mood and Affect: Mood normal.     ED Results / Procedures / Treatments   Labs (all labs ordered are listed, but only abnormal results are displayed) Labs Reviewed  COMPREHENSIVE METABOLIC PANEL  LIPASE, BLOOD  CBC WITH DIFFERENTIAL/PLATELET  URINALYSIS, ROUTINE W REFLEX MICROSCOPIC  TROPONIN I (HIGH SENSITIVITY)    EKG None  Radiology No results found.  Procedures Procedures    Medications Ordered in ED Medications - No data to display  ED Course/ Medical Decision Making/ A&P                                 Medical Decision Making Amount and/or Complexity of Data Reviewed Labs: ordered. Radiology: ordered. ECG/medicine tests: ordered.  Risk OTC drugs.   This patient presents to the ED for abdominal pain, this involves an extensive number of treatment options, and is a complaint that carries with a high risk of complications and morbidity.  The differential diagnosis includes aortic dissection, ACS/MI, enteritis, cholecystitis, GERD, pancreatitis, pneumonia, PUD. This is not an exhaustive list.  Lab tests: I ordered and personally interpreted labs.  The pertinent results include: WBC unremarkable. Hbg unremarkable. Platelets  unremarkable. Electrolytes unremarkable. BUN, creatinine unremarkable.  Troponin is normal.  Lipase normal.  UA is unremarkable.  Imaging studies: I ordered imaging studies, personally reviewed, interpreted imaging and agree with the  radiologist's interpretations. The results include: Chest x-ray showed no active cardiopulmonary disease.  CT abdomen pelvis with no acute abnormalities.  Problem list/ ED course/ Critical interventions/ Medical management: HPI: See above Vital signs within normal range and stable throughout visit. Laboratory/imaging studies significant for: See above. On physical examination, patient is afebrile and appears in no acute distress.  There was tenderness to palpation to the epigastrium, left upper quadrant and left lower quadrant.  Negative peritoneal signs.  No evidence of acute abdomen at this time.  Well-appearing.  Given workup, low suspicion for cholecystitis, cholelithiasis, appendicitis, SBO, diverticulitis.  Unlikely ACS/MI, EKG with no ischemic changes, given timing of pain to presentation to the ER, single troponin was negative so doubt NSTEMI.  Given Tylenol for pain.  Reevaluation of patient after this medication showed that the patient improved.  Labs showed BUN elevated at 27 and creatinine 2.15.  Patient states that he takes 3 potassium pills a day and Lasix as needed for leg swelling.  I told the patient to hold off on the potassium until Monday and see his primary care physician for a lab recheck.  Advised patient to take Tylenol/ibuprofen at home for pain, follow-up with PCP and GI for reevaluation.  Strict return precaution discussed. I have reviewed the patient home medicines and have made adjustments as needed.  Cardiac monitoring/EKG: The patient was maintained on a cardiac monitor.  I personally reviewed and interpreted the cardiac monitor which showed an underlying rhythm of: sinus rhythm.  Additional history obtained: External records from outside source obtained and reviewed including: Chart review including previous notes, labs, imaging.  Consultations obtained:  Disposition Continued outpatient therapy. Follow-up with PCP recommended for reevaluation of symptoms. Treatment  plan discussed with patient.  Pt acknowledged understanding was agreeable to the plan. Worrisome signs and symptoms were discussed with patient, and patient acknowledged understanding to return to the ED if they noticed these signs and symptoms. Patient was stable upon discharge.   This chart was dictated using voice recognition software.  Despite best efforts to proofread,  errors can occur which can change the documentation meaning.          Final Clinical Impression(s) / ED Diagnoses Final diagnoses:  Generalized abdominal pain    Rx / DC Orders ED Discharge Orders     None         Jeanelle Malling, PA 04/02/23 1610    Zadie Rhine, MD 04/02/23 [phone]

## 2023-04-02 ENCOUNTER — Emergency Department (HOSPITAL_COMMUNITY): Payer: Medicare Other

## 2023-04-02 DIAGNOSIS — R1084 Generalized abdominal pain: Secondary | ICD-10-CM | POA: Diagnosis not present

## 2023-04-02 MED ORDER — ACETAMINOPHEN 500 MG PO TABS
1000.0000 mg | ORAL_TABLET | Freq: Once | ORAL | Status: AC
  Administered 2023-04-02: 1000 mg via ORAL
  Filled 2023-04-02: qty 2

## 2023-04-02 MED ORDER — FAMOTIDINE 20 MG PO TABS
20.0000 mg | ORAL_TABLET | Freq: Once | ORAL | Status: AC
  Administered 2023-04-02: 20 mg via ORAL
  Filled 2023-04-02: qty 1

## 2023-04-02 NOTE — Discharge Instructions (Addendum)
Your kidney function tests were elevated today. Please hold off on the potassium until Monday and see your primary care physician for lab recheck.  Please take tylenol/ibuprofen every 6 hours for pain. I recommend close follow-up with gastroenterology and primary care physician for reevaluation.  Please do not hesitate to return to emergency department if worrisome signs symptoms we discussed become apparent.

## 2023-04-02 NOTE — ED Notes (Signed)
Pt awaiting ride in morning from family member and allowed to stay per Charge nurse

## 2023-04-05 DIAGNOSIS — N179 Acute kidney failure, unspecified: Secondary | ICD-10-CM | POA: Insufficient documentation

## 2023-04-13 ENCOUNTER — Other Ambulatory Visit: Payer: Self-pay | Admitting: Cardiology

## 2023-05-20 ENCOUNTER — Other Ambulatory Visit: Payer: Self-pay | Admitting: Cardiology

## 2023-05-20 ENCOUNTER — Telehealth: Payer: Self-pay | Admitting: Cardiology

## 2023-05-20 DIAGNOSIS — I10 Essential (primary) hypertension: Secondary | ICD-10-CM

## 2023-05-20 NOTE — Telephone Encounter (Signed)
Pt c/o medication issue:  1. Name of Medication:    potassium chloride SA (KLOR-CON M20) 20 MEQ tablet   2. How are you currently taking this medication (dosage and times per day)?   Currently not taking  3. Are you having a reaction (difficulty breathing--STAT)?   4. What is your medication issue?   Patient stated he was taken off this medication after his surgery on 9/5 and wants to know if he should start back on this medication and at what dosage.  Patient also wants to know if he will need to do blood work prior to starting back on this medication.

## 2023-05-21 NOTE — Telephone Encounter (Signed)
Called pt regarding message. Pt stated that he was busy and to call him back in 5 minutes.

## 2023-05-21 NOTE — Addendum Note (Signed)
Addended by: Baldo Ash D on: 05/21/2023 04:57 PM   Modules accepted: Orders

## 2023-05-21 NOTE — Telephone Encounter (Signed)
Spoke with pt, advised per Dr. Bing Matter to have blood work drawn to see if he should resume the Potassium. BMP ordered.

## 2023-05-25 LAB — BASIC METABOLIC PANEL
BUN/Creatinine Ratio: 12 (ref 10–24)
BUN: 19 mg/dL (ref 8–27)
CO2: 27 mmol/L (ref 20–29)
Calcium: 9.3 mg/dL (ref 8.6–10.2)
Chloride: 104 mmol/L (ref 96–106)
Creatinine, Ser: 1.59 mg/dL — ABNORMAL HIGH (ref 0.76–1.27)
Glucose: 116 mg/dL — ABNORMAL HIGH (ref 70–99)
Potassium: 3.8 mmol/L (ref 3.5–5.2)
Sodium: 146 mmol/L — ABNORMAL HIGH (ref 134–144)
eGFR: 44 mL/min/{1.73_m2} — ABNORMAL LOW (ref >59)

## 2023-05-27 ENCOUNTER — Telehealth: Payer: Self-pay

## 2023-05-27 ENCOUNTER — Telehealth: Payer: Self-pay | Admitting: Cardiology

## 2023-05-27 NOTE — Telephone Encounter (Signed)
LM to return my call.  ?

## 2023-05-27 NOTE — Telephone Encounter (Signed)
Patient was returning phone call 

## 2023-05-27 NOTE — Telephone Encounter (Signed)
Spoke with pt and charted note.

## 2023-05-27 NOTE — Telephone Encounter (Signed)
Spoke with pt regarding labs and potassium. Pt stated that he only takes Lasix as needed. He asked if he should be taking Potassium every day. Per Wallis Bamberg, NP advised to take Potassium when he takes the Lasix. Pt reluctantly agreed and stated that he would discuss it further with he comes for his appt.

## 2023-05-27 NOTE — Telephone Encounter (Signed)
-----   Message from [place] R Madireddy sent at 05/25/2023  5:57 PM EDT ----- Please inform him that the blood work results show stable electrolytes, kidney function improved in comparison to last months blood work results.

## 2023-06-08 ENCOUNTER — Encounter: Payer: Self-pay | Admitting: Cardiology

## 2023-06-08 ENCOUNTER — Ambulatory Visit: Payer: Medicare Other | Attending: Cardiology | Admitting: Cardiology

## 2023-06-08 VITALS — BP 130/84 | HR 66 | Ht 72.0 in | Wt 174.6 lb

## 2023-06-08 DIAGNOSIS — E782 Mixed hyperlipidemia: Secondary | ICD-10-CM | POA: Diagnosis not present

## 2023-06-08 DIAGNOSIS — I10 Essential (primary) hypertension: Secondary | ICD-10-CM | POA: Diagnosis not present

## 2023-06-08 DIAGNOSIS — I6523 Occlusion and stenosis of bilateral carotid arteries: Secondary | ICD-10-CM | POA: Diagnosis not present

## 2023-06-08 DIAGNOSIS — I2510 Atherosclerotic heart disease of native coronary artery without angina pectoris: Secondary | ICD-10-CM | POA: Diagnosis not present

## 2023-06-08 NOTE — Patient Instructions (Signed)
Medication Instructions:  Your physician recommends that you continue on your current medications as directed. Please refer to the Current Medication list given to you today.  *If you need a refill on your cardiac medications before your next appointment, please call your pharmacy*   Lab Work: Your physician recommends that you return for lab work in: when fasting You need to have labs done when you are fasting.  You can come Monday through Friday 8:30 am to 12:00 pm and 1:15 to 4:30. You do not need to make an appointment as the order has already been placed. The labs you are going to have done are  Lipids.    Testing/Procedures: Your physician has requested that you have a carotid duplex. This test is an ultrasound of the carotid arteries in your neck. It looks at blood flow through these arteries that supply the brain with blood. Allow one hour for this exam. There are no restrictions or special instructions.    Follow-Up: At [facility], you and your health needs are our priority.  As part of our continuing mission to provide you with exceptional heart care, we have created designated Provider Care Teams.  These Care Teams include your primary Cardiologist (physician) and Advanced Practice Providers (APPs -  Physician Assistants and Nurse Practitioners) who all work together to provide you with the care you need, when you need it.  We recommend signing up for the patient portal called "MyChart".  Sign up information is provided on this After Visit Summary.  MyChart is used to connect with patients for Virtual Visits (Telemedicine).  Patients are able to view lab/test results, encounter notes, upcoming appointments, etc.  Non-urgent messages can be sent to your provider as well.   To learn more about what you can do with MyChart, go to [email or web address].    Your next appointment:   6 month(s)  The format for your next appointment:   In Person  Provider:   Gypsy Balsam, MD     Other Instructions NA

## 2023-06-09 NOTE — Progress Notes (Signed)
Cardiology Office Note:    Date:  06/09/2023   ID:  DEMITRE BORTS, DOB September 04, 1945, MRN [id number]  PCP:  Olive Bass, MD  Cardiologist:  Gypsy Balsam, MD    Referring MD: Olive Bass, MD   Chief Complaint  Patient presents with   Medication Management    Lasix, potassium  Questions about possible adding Co Q10    History of Present Illness:    Darrell Williams is a 77 y.o. male   with past medical history significant for peripheral vascular disease, he does have up to 70% stenosis of both carotic arteries, coronary artery disease coronary CT angiogram in 2011 showing 25% LAD.  He follow-up with me for peripheral vascular disease.  Years ago he did have abdominal catastrophe and the being transferred from [facility] to [facility] lost about 30 pounds during the ordeal.  Survive it at it look like he is recovering.  Comes today to months for follow-up overall doing well recently he ended up having abdominal surgery done for hernia and he is already feeling better.  Denies have any chest pain tightness squeezing pressure burning chest.  He lost some weight he is concerned about it but gradually started eating better and gaining weight.  Past Medical History:  Diagnosis Date   Abdominal pain 01/07/2019   Acute pain of right shoulder 09/27/2018   2020   Allergic rhinitis 05/06/2016   At risk for sleep apnea    STOP-BANG= 4    SENT TO PCP 07-16-2014   Bilateral carotid artery stenosis    Bilateral ICA  60-79%  per last duplex 02-21-2014   Bilateral lower extremity edema 04/14/2019   2020   Bilateral ureteral calculi    BPH (benign prostatic hypertrophy)    Carbuncle 12/01/2019   Formatting of this note might be different from the original. 2021: left buttocks 2021: left buttocks, recurrent   Carotid stenosis 07/25/2009   Qualifier: Diagnosis of  By: Al-Rammal, RVT, RDCS, Missy     CHEST PAIN, PRECORDIAL 04/14/2010   Qualifier: Diagnosis of  By: Gala Romney, MD,  Trixie Dredge    Coronary atherosclerosis of native coronary artery    cardiologist-  dr bensinhom--  pLAD 25%  per CT scan   Cough variant asthma 05/29/2016   See pfts 05/13/16  Ratios are nl but there is curvature to f/v on no rx and s gerd rx which was started 05/29/2016    Dizziness 04/26/2018   2019   Dyspnea on exertion 01/16/2009   Qualifier: Diagnosis of  By: Gala Romney, MD, Trixie Dredge    Elevated PSA 07/21/2016   Overview:  Managed UROL   Frequency of urination    Glaucoma 05/06/2016   Glaucoma of both eyes    Helicobacter pylori (H. pylori) infection 01/20/2019   2020: 4 drug treatment, [facility]   Hoarseness 09/16/2020   Hyperlipidemia    Hyperlipidemia with target LDL less than 70 01/16/2009   Qualifier: Diagnosis of  By: Susette Racer CMA, Jewel     Hypertension    HYPERTENSION, BENIGN 01/16/2009   Qualifier: Diagnosis of  By: Susette Racer CMA, Jewel     HYPOKALEMIA 01/16/2009   Qualifier: Diagnosis of  By: Gala Romney, MD, Trixie Dredge    Nephrolithiasis 07/23/2014   Paraesophageal hiatal hernia 01/18/2019   2020: prolonged hosp with colon herniation, poor intake underwent lap HHR with gastropexy   Plaque    cholesterol plaque--on his retina exam   Prediabetes 08/02/2018  2019: cards labs: x/6.2   Sebaceous cyst 02/13/2020   Formatting of this note might be different from the original. 2021: back   Swelling of both lower extremities 05/18/2019   Unilateral recurrent inguinal hernia without obstruction or gangrene 10/30/2018   1980: BIH 2020: recurrent right   Upper airway cough syndrome 05/29/2016   PFT's 05/13/16 FEV1  1.53 (44%) ratio 72 with severely truncated insp loop and mildly concave curvature to expiratory portion Max GERD 05/29/2016 >>>  - FENO 05/29/2016  =   19  - Allergy profile 05/29/2016 >  Eos 0.2 /  IgE  170 neg RAST  - Spirometry 07/14/2016  FEV1 1.81 (53%)  Ratio 80 with no curvature at all  > try singulair x 30 days and then ent eval if not better  - Eval by Dr Delford Field 08/13/16    Ureteral calculi 07/23/2014   Urgency of urination    Wart 05/06/2016   Overview:  2017: RUE   Wears glasses    Wellness examination 05/20/2016    Past Surgical History:  Procedure Laterality Date   CARDIOVASCULAR STRESS TEST  08-12-2005  dr Myrtis Ser   normal nuclear study/  no ischemia /  ef 67%---  11/ 2009  normal ETT   CATARACT EXTRACTION W/ INTRAOCULAR LENS  IMPLANT, BILATERAL     COLONOSCOPY  2005 (approx)   CYSTOSCOPY WITH RETROGRADE PYELOGRAM, URETEROSCOPY AND STENT PLACEMENT Bilateral 07/23/2014   Procedure: CYSTOSCOPY WITH BILATERAL RETROGRADE PYELOGRAM, BILATERAL URETEROSCOPY AND STENT PLACEMENT, STONE EXTRACTION;  Surgeon: Valetta Fuller, MD;  Location: [facility];  Service: Urology;  Laterality: Bilateral;   HOLMIUM LASER APPLICATION Bilateral 07/23/2014   Procedure: HOLMIUM LASER APPLICATION;  Surgeon: Valetta Fuller, MD;  Location[facility];  Service: Urology;  Laterality: Bilateral;   INGUINAL HERNIA REPAIR Bilateral 1995  (approx)    Current Medications: Current Meds  Medication Sig   amLODipine (NORVASC) 5 MG tablet TAKE 1 TABLET BY MOUTH TWICE  DAILY   aspirin EC 81 MG tablet Take 81 mg by mouth daily.   b complex vitamins capsule Take 1 capsule by mouth daily. Unknown strenght   ezetimibe (ZETIA) 10 MG tablet TAKE 1 TABLET BY MOUTH DAILY   famotidine (PEPCID) 20 MG tablet TAKE 1 TABLET BY MOUTH TWO  TIMES DAILY (Patient taking differently: Take 20 mg by mouth as needed for heartburn or indigestion.)   fluticasone (FLONASE) 50 MCG/ACT nasal spray Place 2 sprays into both nostrils as needed for allergies or rhinitis.   furosemide (LASIX) 40 MG tablet Take 1 tablet (40 mg total) by mouth as needed for fluid or edema.   gabapentin (NEURONTIN) 100 MG capsule Take 100 mg by mouth as needed (nerve pain).   ipratropium (ATROVENT) 0.03 % nasal spray Place 1 spray into both nostrils daily.   KLOR-CON M20 20 MEQ tablet Take 2 tablets (40 mEq  total) by mouth every morning AND 1 tablet (20 mEq total) every evening.   latanoprost (XALATAN) 0.005 % ophthalmic solution Place 1 drop into both eyes at bedtime.   loratadine (CLARITIN) 10 MG tablet Take 10 mg by mouth daily as needed for allergies.   Melatonin 5 MG CAPS Take 1 capsule by mouth at bedtime.   Multiple Vitamins-Minerals (MULTIVITAMIN WITH MINERALS) tablet Take 1 tablet by mouth daily. Unknown strength   Omega-3 1000 MG CAPS Take 1 capsule by mouth daily.   pantoprazole (PROTONIX) 40 MG tablet Take 1 tablet by mouth daily.   rosuvastatin (  CRESTOR) 40 MG tablet TAKE 1 TABLET BY MOUTH DAILY   tamsulosin (FLOMAX) 0.4 MG CAPS capsule Take 0.4 mg by mouth in the morning and at bedtime.    timolol (TIMOPTIC) 0.5 % ophthalmic solution Place 1 drop into both eyes 2 (two) times daily.   VITAMIN D, CHOLECALCIFEROL, PO Take 1 tablet by mouth daily. Unknown strength   vitamin E 400 UNIT capsule Take 400 Units by mouth daily.     Allergies:   Ace inhibitors and Codeine   Social History   Socioeconomic History   Marital status: Single    Spouse name: Not on file   Number of children: Not on file   Years of education: Not on file   Highest education level: Not on file  Occupational History   Not on file  Tobacco Use   Smoking status: Never   Smokeless tobacco: Never  Vaping Use   Vaping status: Never Used  Substance and Sexual Activity   Alcohol use: Yes    Comment: occasional   Drug use: No   Sexual activity: Not on file  Other Topics Concern   Not on file  Social History Narrative   Not on file   Social Determinants of Health   Financial Resource Strain: Low Risk  (04/13/2023)   Received from [facility]   Overall Financial Resource Strain (CARDIA)    Difficulty of Paying Living Expenses: Not hard at all  Food Insecurity: No Food Insecurity (04/13/2023)   Received from [facility]   Hunger Vital Sign    Worried About Running Out  of Food in the Last Year: Never true    Ran Out of Food in the Last Year: Never true  Transportation Needs: No Transportation Needs (04/13/2023)   Received from [facility] - Transportation    In the past 12 months, has lack of transportation kept you from medical appointments or from getting medications?: No    Lack of Transportation (Non-Medical): No  Physical Activity: Not on file  Stress: Not on file  Social Connections: Not on file     Family History: The patient's family history includes Diabetes (age of onset: 51) in his paternal uncle; Hypertension in his father and mother; Stroke in his father. ROS:   Please see the history of present illness.    All 14 point review of systems negative except as described per history of present illness  EKGs/Labs/Other Studies Reviewed:         Recent Labs: 04/01/2023: ALT 26; Hemoglobin 14.8; Platelets 193 05/24/2023: BUN 19; Creatinine, Ser 1.59; Potassium 3.8; Sodium 146  Recent Lipid Panel    Component Value Date/Time   CHOL 162 05/20/2022 1710   TRIG 87 05/20/2022 1710   TRIG 68 07/14/2006 1515   HDL 65 05/20/2022 1710   CHOLHDL 2.5 05/20/2022 1710   CHOLHDL 2.5 07/27/2018 1522   VLDL 21 07/27/2018 1522   LDLCALC 81 05/20/2022 1710    Physical Exam:    VS:  BP 130/84 (BP Location: Left Arm, Patient Position: Sitting)   Pulse 66   Ht 6' (1.829 m)   Wt 174 lb 9.6 oz (79.2 kg)   SpO2 95%   BMI 23.68 kg/m     Wt Readings from Last 3 Encounters:  06/08/23 174 lb 9.6 oz (79.2 kg)  04/01/23 180 lb (81.6 kg)  05/20/22 179 lb (81.2 kg)     GEN:  Well nourished, well developed in no  acute distress HEENT: Normal NECK: No JVD; No carotid bruits LYMPHATICS: No lymphadenopathy CARDIAC: RRR, no murmurs, no rubs, no gallops RESPIRATORY:  Clear to auscultation without rales, wheezing or rhonchi  ABDOMEN: Soft, non-tender, non-distended MUSCULOSKELETAL:  No edema; No deformity  SKIN: Warm and  dry LOWER EXTREMITIES: no swelling NEUROLOGIC:  Alert and oriented x 3 PSYCHIATRIC:  Normal affect   ASSESSMENT:    1. Essential hypertension, benign   2. Atherosclerosis of native coronary artery of native heart without angina pectoris   3. Mixed hyperlipidemia   4. Bilateral carotid artery stenosis    PLAN:    In order of problems listed above:  Essential hypertension: Blood pressure seems to well-controlled continue present management. Coronary disease denies have any signs and symptoms of reactivation of the problem. Mixed dyslipidemia in the future we will repeat fasting lipid profile. Bilateral carotic artery stenosis we will schedule him to have carotic ultrasound. He have question about Lasix and potassium but he does not take it for now and I do not think you need it.  Another question he got was about coenzyme Q 10   Medication Adjustments/Labs and Tests Ordered: Current medicines are reviewed at length with the patient today.  Concerns regarding medicines are outlined above.  Orders Placed This Encounter  Procedures   Lipid panel   EKG 12-Lead   VAS US CAROTID   Medication changes: No orders of the defined types were placed in this encounter.   Signed, Georgeanna Lea, MD, [facility] 06/09/2023 2:44 PM    [facility]

## 2023-06-10 ENCOUNTER — Ambulatory Visit: Payer: Medicare Other | Admitting: Cardiology

## 2023-06-10 ENCOUNTER — Ambulatory Visit: Payer: Medicare Other | Attending: Cardiology

## 2023-06-10 DIAGNOSIS — I2510 Atherosclerotic heart disease of native coronary artery without angina pectoris: Secondary | ICD-10-CM | POA: Insufficient documentation

## 2023-06-10 DIAGNOSIS — I6523 Occlusion and stenosis of bilateral carotid arteries: Secondary | ICD-10-CM | POA: Diagnosis not present

## 2023-06-15 ENCOUNTER — Other Ambulatory Visit: Payer: Self-pay | Admitting: Cardiology

## 2023-06-17 ENCOUNTER — Telehealth: Payer: Self-pay

## 2023-06-17 NOTE — Telephone Encounter (Signed)
Carotid US Results reviewed with pt as per Dr. Krasowski's note.  Pt verbalized understanding and had no additional questions. Routed to PCP  

## 2023-09-02 ENCOUNTER — Other Ambulatory Visit: Payer: Self-pay | Admitting: Cardiology

## 2023-10-19 ENCOUNTER — Ambulatory Visit: Payer: Self-pay | Admitting: General Surgery

## 2023-12-29 ENCOUNTER — Ambulatory Visit: Attending: Cardiology | Admitting: Cardiology

## 2023-12-29 VITALS — BP 98/68 | HR 73 | Ht 72.0 in | Wt 174.4 lb

## 2023-12-29 DIAGNOSIS — I2510 Atherosclerotic heart disease of native coronary artery without angina pectoris: Secondary | ICD-10-CM | POA: Diagnosis present

## 2023-12-29 DIAGNOSIS — I6523 Occlusion and stenosis of bilateral carotid arteries: Secondary | ICD-10-CM | POA: Diagnosis present

## 2023-12-29 DIAGNOSIS — R0609 Other forms of dyspnea: Secondary | ICD-10-CM

## 2023-12-29 NOTE — Progress Notes (Unsigned)
 Cardiology Office Note:    Date:  12/29/2023   ID:  Darrell Williams, DOB 1946-08-24, MRN [id number]  PCP:  Darrell Schirmer, MD  Cardiologist:  Darrell Burger, MD    Referring MD: Darrell Schirmer, MD   Chief Complaint  Patient presents with   Medication Management    History of Present Illness:    Darrell Williams is a 78 y.o. male  with past medical history significant for peripheral vascular disease, he does have up to 70% stenosis of both carotic arteries, coronary artery disease coronary CT angiogram in 2011 showing 25% LAD.  He follow-up with me for peripheral vascular disease.  Years ago he did have abdominal catastrophe and the being transferred from [facility] to [facility] lost about 30 pounds during the ordeal.  Survive it at it look like he is recovering.  Comes today to months for follow-up overall doing well recently he ended up having abdominal surgery done for hernia and he is already feeling better.  Comes today to my office overall doing well.  He does have complaints very nonspecific for cardiac point of view complaint of having stiffness in his wrist also some constipation.  Otherwise no chest pain tightness squeezing pressure burning chest no TIA or CVA-like symptoms  Past Medical History:  Diagnosis Date   Abdominal pain 01/07/2019   Acute pain of right shoulder 09/27/2018   2020   Allergic rhinitis 05/06/2016   At risk for sleep apnea    STOP-BANG= 4    SENT TO PCP 07-16-2014   Bilateral carotid artery stenosis    Bilateral ICA  60-79%  per last duplex 02-21-2014   Bilateral lower extremity edema 04/14/2019   2020   Bilateral ureteral calculi    BPH (benign prostatic hypertrophy)    Carbuncle 12/01/2019   Formatting of this note might be different from the original. 2021: left buttocks 2021: left buttocks, recurrent   Carotid stenosis 07/25/2009   Qualifier: Diagnosis of  By: Al-Rammal, RVT, RDCS, Missy     CHEST PAIN, PRECORDIAL 04/14/2010    Qualifier: Diagnosis of  By: Julane Ny, MD, Dois Freeze    Coronary atherosclerosis of native coronary artery    cardiologist-  dr bensinhom--  pLAD 25%  per CT scan   Cough variant asthma 05/29/2016   See pfts 05/13/16  Ratios are nl but there is curvature to f/v on no rx and s gerd rx which was started 05/29/2016    Dizziness 04/26/2018   2019   Dyspnea on exertion 01/16/2009   Qualifier: Diagnosis of  By: Julane Ny, MD, Dois Freeze    Elevated PSA 07/21/2016   Overview:  Managed UROL   Frequency of urination    Glaucoma 05/06/2016   Glaucoma of both eyes    Helicobacter pylori (H. pylori) infection 01/20/2019   2020: 4 drug treatment, [facility]   Hoarseness 09/16/2020   Hyperlipidemia    Hyperlipidemia with target LDL less than 70 01/16/2009   Qualifier: Diagnosis of  By: Sallyanne Creamer CMA, Jewel     Hypertension    HYPERTENSION, BENIGN 01/16/2009   Qualifier: Diagnosis of  By: Sallyanne Creamer CMA, Jewel     HYPOKALEMIA 01/16/2009   Qualifier: Diagnosis of  By: Julane Ny, MD, Si Draft R    Nephrolithiasis 07/23/2014   Paraesophageal hiatal hernia 01/18/2019   2020: prolonged hosp with colon herniation, poor intake underwent lap HHR with gastropexy   Plaque    cholesterol plaque--on his retina exam  Prediabetes 08/02/2018   2019: cards labs: x/6.2   Sebaceous cyst 02/13/2020   Formatting of this note might be different from the original. 2021: back   Swelling of both lower extremities 05/18/2019   Unilateral recurrent inguinal hernia without obstruction or gangrene 10/30/2018   1980: BIH 2020: recurrent right   Upper airway cough syndrome 05/29/2016   PFT's 05/13/16 FEV1  1.53 (44%) ratio 72 with severely truncated insp loop and mildly concave curvature to expiratory portion Max GERD 05/29/2016 >>>  - FENO 05/29/2016  =   19  - Allergy  profile 05/29/2016 >  Eos 0.2 /  IgE  170 neg RAST  - Spirometry 07/14/2016  FEV1 1.81 (53%)  Ratio 80 with no curvature at all  > try singulair  x 30 days and then ent eval if  not better  - Eval by Dr Brent Cambric 08/13/16   Ureteral calculi 07/23/2014   Urgency of urination    Wart 05/06/2016   Overview:  2017: RUE   Wears glasses    Wellness examination 05/20/2016    Past Surgical History:  Procedure Laterality Date   CARDIOVASCULAR STRESS TEST  08-12-2005  dr Ardell Koller   normal nuclear study/  no ischemia /  ef 67%---  11/ 2009  normal ETT   CATARACT EXTRACTION W/ INTRAOCULAR LENS  IMPLANT, BILATERAL     COLONOSCOPY  2005 (approx)   CYSTOSCOPY WITH RETROGRADE PYELOGRAM, URETEROSCOPY AND STENT PLACEMENT Bilateral 07/23/2014   Procedure: CYSTOSCOPY WITH BILATERAL RETROGRADE PYELOGRAM, BILATERAL URETEROSCOPY AND STENT PLACEMENT, STONE EXTRACTION;  Surgeon: Livingston Rigg, MD;  Location[facility];  Service: Urology;  Laterality: Bilateral;   HOLMIUM LASER APPLICATION Bilateral 07/23/2014   Procedure: HOLMIUM LASER APPLICATION;  Surgeon: Livingston Rigg, MD;  Location: [facility];  Service: Urology;  Laterality: Bilateral;   INGUINAL HERNIA REPAIR Bilateral 1995  (approx)    Current Medications: Current Meds  Medication Sig   amLODipine  (NORVASC ) 5 MG tablet TAKE 1 TABLET BY MOUTH TWICE  DAILY   aspirin EC 81 MG tablet Take 81 mg by mouth daily.   b complex vitamins capsule Take 1 capsule by mouth daily. Unknown strenght   ezetimibe  (ZETIA ) 10 MG tablet TAKE 1 TABLET BY MOUTH DAILY   famotidine  (PEPCID ) 20 MG tablet TAKE 1 TABLET BY MOUTH TWO  TIMES DAILY (Patient taking differently: Take 20 mg by mouth as needed for heartburn or indigestion.)   fluticasone (FLONASE) 50 MCG/ACT nasal spray Place 2 sprays into both nostrils as needed for allergies or rhinitis.   furosemide  (LASIX ) 40 MG tablet Take 1 tablet (40 mg total) by mouth as needed for fluid or edema.   gabapentin (NEURONTIN) 100 MG capsule Take 100 mg by mouth as needed (nerve pain).   ipratropium (ATROVENT) 0.03 % nasal spray Place 1 spray into both nostrils daily.   KLOR-CON   M20 20 MEQ tablet Take 2 tablets (40 mEq total) by mouth every morning AND 1 tablet (20 mEq total) every evening.   latanoprost (XALATAN) 0.005 % ophthalmic solution Place 1 drop into both eyes at bedtime.   loratadine (CLARITIN) 10 MG tablet Take 10 mg by mouth daily as needed for allergies.   Melatonin 5 MG CAPS Take 1 capsule by mouth at bedtime.   Multiple Vitamins-Minerals (MULTIVITAMIN WITH MINERALS) tablet Take 1 tablet by mouth daily. Unknown strength   Omega-3 1000 MG CAPS Take 1 capsule by mouth daily.   pantoprazole  (PROTONIX ) 40 MG tablet Take 1 tablet by mouth  daily.   rosuvastatin  (CRESTOR ) 40 MG tablet TAKE 1 TABLET BY MOUTH DAILY   tamsulosin (FLOMAX) 0.4 MG CAPS capsule Take 0.4 mg by mouth in the morning and at bedtime.    timolol (TIMOPTIC) 0.5 % ophthalmic solution Place 1 drop into both eyes 2 (two) times daily.   VITAMIN A PO Take 1 tablet by mouth daily.   VITAMIN D, CHOLECALCIFEROL, PO Take 1 tablet by mouth daily. Unknown strength   vitamin E 400 UNIT capsule Take 400 Units by mouth daily.     Allergies:   Ace inhibitors and Codeine   Social History   Socioeconomic History   Marital status: Single    Spouse name: Not on file   Number of children: Not on file   Years of education: Not on file   Highest education level: Not on file  Occupational History   Not on file  Tobacco Use   Smoking status: Never   Smokeless tobacco: Never  Vaping Use   Vaping status: Never Used  Substance and Sexual Activity   Alcohol use: Yes    Comment: occasional   Drug use: No   Sexual activity: Not on file  Other Topics Concern   Not on file  Social History Narrative   Not on file   Social Drivers of Health   Financial Resource Strain: Low Risk  (04/13/2023)   Received from [facility]   Overall Financial Resource Strain (CARDIA)    Difficulty of Paying Living Expenses: Not hard at all  Food Insecurity: No Food Insecurity (04/13/2023)   Received from  [facility]   Hunger Vital Sign    Worried About Running Out of Food in the Last Year: Never true    Ran Out of Food in the Last Year: Never true  Transportation Needs: No Transportation Needs (04/13/2023)   Received from [facility]    In the past 12 months, has lack of transportation kept you from medical appointments or from getting medications?: No    Lack of Transportation (Non-Medical): No  Physical Activity: Not on file  Stress: Not on file  Social Connections: Not on file     Family History: The patient's family history includes Diabetes (age of onset: 72) in his paternal uncle; Hypertension in his father and mother; Stroke in his father. ROS:   Please see the history of present illness.    All 14 point review of systems negative except as described per history of present illness  EKGs/Labs/Other Studies Reviewed:         Recent Labs: 04/01/2023: ALT 26; Hemoglobin 14.8; Platelets 193 05/24/2023: BUN 19; Creatinine, Ser 1.59; Potassium 3.8; Sodium 146  Recent Lipid Panel    Component Value Date/Time   CHOL 162 05/20/2022 1710   TRIG 87 05/20/2022 1710   TRIG 68 07/14/2006 1515   HDL 65 05/20/2022 1710   CHOLHDL 2.5 05/20/2022 1710   CHOLHDL 2.5 07/27/2018 1522   VLDL 21 07/27/2018 1522   LDLCALC 81 05/20/2022 1710    Physical Exam:    VS:  BP 98/68 (BP Location: Left Arm, Patient Position: Sitting)   Pulse 73   Ht 6' (1.829 m)   Wt 174 lb 6.4 oz (79.1 kg)   SpO2 97%   BMI 23.65 kg/m     Wt Readings from Last 3 Encounters:  12/29/23 174 lb 6.4 oz (79.1 kg)  06/08/23 174 lb 9.6 oz (79.2 kg)  04/01/23 180 lb (81.6 kg)     GEN:  Well nourished, well developed in no acute distress HEENT: Normal NECK: No JVD; No carotid bruits LYMPHATICS: No lymphadenopathy CARDIAC: RRR, no murmurs, no rubs, no gallops RESPIRATORY:  Clear to auscultation without rales, wheezing or rhonchi  ABDOMEN: Soft,  non-tender, non-distended MUSCULOSKELETAL:  No edema; No deformity  SKIN: Warm and dry LOWER EXTREMITIES: no swelling NEUROLOGIC:  Alert and oriented x 3 PSYCHIATRIC:  Normal affect   ASSESSMENT:    1. Bilateral carotid artery stenosis   2. Atherosclerosis of native coronary artery of native heart without angina pectoris   3. Dyspnea on exertion    PLAN:    In order of problems listed above:  Bilateral carotic artery stenosis, was scheduled to have cortical stroke. Atherosclerosis of the coronary arteries asymptomatic, probably diagnosed directed medical therapy. Dyslipidemia I did review K PN show me data from 2023 he is LDL 81 HDL 65 will repeat the score he is fasting lipid profile   Medication Adjustments/Labs and Tests Ordered: Current medicines are reviewed at length with the patient today.  Concerns regarding medicines are outlined above.  No orders of the defined types were placed in this encounter.  Medication changes: No orders of the defined types were placed in this encounter.   Signed, Manfred Seed, MD, [facility] 12/29/2023 5:02 PM    [facility]

## 2023-12-29 NOTE — Patient Instructions (Addendum)
 Medication Instructions:  Your physician recommends that you continue on your current medications as directed. Please refer to the Current Medication list given to you today.  *If you need a refill on your cardiac medications before your next appointment, please call your pharmacy*   Lab Work: Your physician recommends that you return for lab work in: when fasting You need to have labs done when you are fasting.  You can come Monday through Friday 8:30 am to 12:00 pm and 1:15 to 4:30. You do not need to make an appointment as the order has already been placed. The labs you are going to have done are BMP, Lipid.    Testing/Procedures: Your physician has requested that you have a carotid duplex. This test is an ultrasound of the carotid arteries in your neck. It looks at blood flow through these arteries that supply the brain with blood. Allow one hour for this exam. There are no restrictions or special instructions.    Follow-Up: At [facility], you and your health needs are our priority.  As part of our continuing mission to provide you with exceptional heart care, we have created designated Provider Care Teams.  These Care Teams include your primary Cardiologist (physician) and Advanced Practice Providers (APPs -  Physician Assistants and Nurse Practitioners) who all work together to provide you with the care you need, when you need it.  We recommend signing up for the patient portal called "MyChart".  Sign up information is provided on this After Visit Summary.  MyChart is used to connect with patients for Virtual Visits (Telemedicine).  Patients are able to view lab/test results, encounter notes, upcoming appointments, etc.  Non-urgent messages can be sent to your provider as well.   To learn more about what you can do with MyChart, go to [email or web address].    Your next appointment:   6 month(s)  The format for your next appointment:   In Person  Provider:   Ralene Burger,  MD    Other Instructions NA

## 2023-12-30 DIAGNOSIS — H903 Sensorineural hearing loss, bilateral: Secondary | ICD-10-CM | POA: Insufficient documentation

## 2023-12-30 DIAGNOSIS — H9313 Tinnitus, bilateral: Secondary | ICD-10-CM | POA: Insufficient documentation

## 2024-01-08 LAB — BASIC METABOLIC PANEL WITH GFR
BUN/Creatinine Ratio: 12 (ref 10–24)
BUN: 19 mg/dL (ref 8–27)
CO2: 23 mmol/L (ref 20–29)
Calcium: 9.2 mg/dL (ref 8.6–10.2)
Chloride: 104 mmol/L (ref 96–106)
Creatinine, Ser: 1.56 mg/dL — ABNORMAL HIGH (ref 0.76–1.27)
Glucose: 93 mg/dL (ref 70–99)
Potassium: 4.2 mmol/L (ref 3.5–5.2)
Sodium: 143 mmol/L (ref 134–144)
eGFR: 45 mL/min/{1.73_m2} — ABNORMAL LOW (ref >59)

## 2024-01-08 LAB — LIPID PANEL
Chol/HDL Ratio: 2.3 ratio (ref 0.0–5.0)
Cholesterol, Total: 147 mg/dL (ref 100–199)
HDL: 64 mg/dL (ref >39)
LDL Chol Calc (NIH): 67 mg/dL (ref 0–99)
Triglycerides: 85 mg/dL (ref 0–149)
VLDL Cholesterol Cal: 16 mg/dL (ref 5–40)

## 2024-01-12 ENCOUNTER — Ambulatory Visit: Payer: Self-pay | Admitting: Cardiology

## 2024-01-19 ENCOUNTER — Ambulatory Visit (HOSPITAL_COMMUNITY)
Admission: RE | Admit: 2024-01-19 | Discharge: 2024-01-19 | Disposition: A | Discharge status: Home / Self Care | Source: Ambulatory Visit | Attending: Cardiology | Admitting: Cardiology

## 2024-01-19 DIAGNOSIS — I6523 Occlusion and stenosis of bilateral carotid arteries: Secondary | ICD-10-CM | POA: Diagnosis not present

## 2024-01-25 ENCOUNTER — Telehealth: Payer: Self-pay

## 2024-01-25 NOTE — Telephone Encounter (Signed)
 Lab Results reviewed with pt as per Dr. Vanetta Shawl note.  Pt verbalized understanding and had no additional questions. Routed to PCP

## 2024-01-25 NOTE — Telephone Encounter (Signed)
 [place] Results reviewed with pt as per Dr. Vanetta Shawl note.  Pt verbalized understanding and had no additional questions. Routed to PCP

## 2024-02-09 ENCOUNTER — Other Ambulatory Visit: Payer: Self-pay

## 2024-04-04 ENCOUNTER — Telehealth: Payer: Self-pay | Admitting: Cardiology

## 2024-04-04 NOTE — Telephone Encounter (Signed)
 Patient is requesting to speak with the nurse. Patient did not state what it was in regard to. Please advise.

## 2024-04-05 NOTE — Telephone Encounter (Signed)
 Spoke with pt. He would like appt to discuss Carotid US  before his 6 month appt. Information sent to front desk to schedule appt.

## 2024-04-13 ENCOUNTER — Ambulatory Visit: Admitting: Cardiology

## 2024-04-13 ENCOUNTER — Encounter: Payer: Self-pay | Admitting: *Deleted

## 2024-04-21 ENCOUNTER — Ambulatory Visit: Attending: Cardiology | Admitting: Cardiology

## 2024-04-21 ENCOUNTER — Encounter: Payer: Self-pay | Admitting: Cardiology

## 2024-04-21 VITALS — BP 138/86 | HR 67 | Ht 72.0 in | Wt 164.0 lb

## 2024-04-21 DIAGNOSIS — I10 Essential (primary) hypertension: Secondary | ICD-10-CM | POA: Diagnosis present

## 2024-04-21 DIAGNOSIS — E782 Mixed hyperlipidemia: Secondary | ICD-10-CM | POA: Insufficient documentation

## 2024-04-21 DIAGNOSIS — I6523 Occlusion and stenosis of bilateral carotid arteries: Secondary | ICD-10-CM | POA: Diagnosis present

## 2024-04-21 DIAGNOSIS — R0609 Other forms of dyspnea: Secondary | ICD-10-CM | POA: Insufficient documentation

## 2024-04-21 DIAGNOSIS — I2510 Atherosclerotic heart disease of native coronary artery without angina pectoris: Secondary | ICD-10-CM | POA: Diagnosis present

## 2024-04-21 MED ORDER — NEXLIZET 180-10 MG PO TABS: 1.0000 | ORAL_TABLET | Freq: Every day | ORAL | Status: DC

## 2024-04-21 NOTE — Patient Instructions (Addendum)
 Medication Instructions:   STOP: Zetia   START: Nexlizet  1 tablet daily   Lab Work: [employer]- today  Your physician recommends that you return for lab work in: 6 weeks You need to have labs done when you are fasting.  You can come Monday through Friday 8:30 am to 12:00 pm and 1:15 to 4:30. You do not need to make an appointment as the order has already been placed. The labs you are going to have done are AST, ALT Lipids.    Testing/Procedures: December Your physician has requested that you have a carotid duplex. This test is an ultrasound of the carotid arteries in your neck. It looks at blood flow through these arteries that supply the brain with blood. Allow one hour for this exam. There are no restrictions or special instructions.    Follow-Up: At [facility], you and your health needs are our priority.  As part of our continuing mission to provide you with exceptional heart care, we have created designated Provider Care Teams.  These Care Teams include your primary Cardiologist (physician) and Advanced Practice Providers (APPs -  Physician Assistants and Nurse Practitioners) who all work together to provide you with the care you need, when you need it.  We recommend signing up for the patient portal called MyChart.  Sign up information is provided on this After Visit Summary.  MyChart is used to connect with patients for Virtual Visits (Telemedicine).  Patients are able to view lab/test results, encounter notes, upcoming appointments, etc.  Non-urgent messages can be sent to your provider as well.   To learn more about what you can do with MyChart, go to [email or web address].    Your next appointment:   January 2026  The format for your next appointment:   In Person  Provider:   Lamar Fitch, MD    Other Instructions NA

## 2024-04-21 NOTE — Progress Notes (Signed)
 Cardiology Office Note:    Date:  04/21/2024   ID:  Darrell Williams, DOB April 27, 1946, MRN [id number]  PCP:  Darrell Darrell CROME, MD  Cardiologist:  Darrell Fitch, MD    Referring MD: Darrell Darrell CROME, MD   No chief complaint on file.   History of Present Illness:    Darrell Williams is a 78 y.o. male past medical history significant for peripheral vascular disease, up to 69% stenosis of the carotic artery, coronary CT angio in 2021 showed 25% LAD, essential hypertension, dyslipidemia.  Comes today to my office to talk about it.  No TIA CVA-like symptoms but he is very worried about his narrowing and truly am concerned about this as well.  His cholesterol seems to be well-managed but in spite of that there is progression of his narrowing of her cardiac artery.  Denies have any chest pain tightness squeezing pressure burning chest still does his routine exercises walking and no problem  Past Medical History:  Diagnosis Date   Abdominal pain 01/07/2019   Acute pain of right shoulder 09/27/2018   2020   Allergic rhinitis 05/06/2016   At risk for sleep apnea    STOP-BANG= 4    SENT TO PCP 07-16-2014   Bilateral carotid artery stenosis    Bilateral ICA  60-79%  per last duplex 02-21-2014   Bilateral lower extremity edema 04/14/2019   2020   Bilateral ureteral calculi    BPH (benign prostatic hypertrophy)    Carbuncle 12/01/2019   Formatting of this note might be different from the original. 2021: left buttocks 2021: left buttocks, recurrent   Carotid stenosis 07/25/2009   Qualifier: Diagnosis of  By: Darrell, Williams, RDCS, Darrell Williams     CHEST PAIN, PRECORDIAL 04/14/2010   Qualifier: Diagnosis of  By: Cherrie, MD, CODY Toribio Williams    Coronary atherosclerosis of native coronary artery    cardiologist-  Darrell bensinhom--  pLAD 25%  per CT scan   Cough variant asthma 05/29/2016   See pfts 05/13/16  Ratios are nl but there is curvature to f/v on no rx and s gerd rx which was started 05/29/2016    Dizziness  04/26/2018   2019   Dyspnea on exertion 01/16/2009   Qualifier: Diagnosis of  By: Cherrie, MD, CODY Toribio Williams    Elevated PSA 07/21/2016   Overview:  Managed UROL   Frequency of urination    Glaucoma 05/06/2016   Glaucoma of both eyes    Helicobacter pylori (H. pylori) infection 01/20/2019   2020: 4 drug treatment, [facility]   Hoarseness 09/16/2020   Hyperlipidemia    Hyperlipidemia with target LDL less than 70 01/16/2009   Qualifier: Diagnosis of  By: Darrell CMA, Williams     Hypertension    HYPERTENSION, BENIGN 01/16/2009   Qualifier: Diagnosis of  By: Darrell CMA, Williams     HYPOKALEMIA 01/16/2009   Qualifier: Diagnosis of  By: Cherrie, MD, CODY Toribio Williams    Nephrolithiasis 07/23/2014   Paraesophageal hiatal hernia 01/18/2019   2020: prolonged hosp with colon herniation, poor intake underwent lap HHR with gastropexy   Plaque    cholesterol plaque--on his retina exam   Prediabetes 08/02/2018   2019: cards labs: x/6.2   Sebaceous cyst 02/13/2020   Formatting of this note might be different from the original. 2021: back   Swelling of both lower extremities 05/18/2019   Unilateral recurrent inguinal hernia without obstruction or gangrene 10/30/2018   1980: [facility] 2020: recurrent right  Upper airway cough syndrome 05/29/2016   PFT's 05/13/16 FEV1  1.53 (44%) ratio 72 with severely truncated insp loop and mildly concave curvature to expiratory portion Max GERD 05/29/2016 >>>  - FENO 05/29/2016  =   19  - Allergy  profile 05/29/2016 >  Eos 0.2 /  IgE  170 neg RAST  - Spirometry 07/14/2016  FEV1 1.81 (53%)  Ratio 80 with no curvature at all  > try singulair  x 30 days and then ent eval if not better  - Eval by Darrell Brien 08/13/16   Ureteral calculi 07/23/2014   Urgency of urination    Wart 05/06/2016   Overview:  2017: RUE   Wears glasses    Wellness examination 05/20/2016    Past Surgical History:  Procedure Laterality Date   CARDIOVASCULAR STRESS TEST  08-12-2005  Darrell [facility]   normal nuclear study/  no ischemia  /  ef 67%---  11/ 2009  normal ETT   CATARACT EXTRACTION W/ INTRAOCULAR LENS  IMPLANT, BILATERAL     COLONOSCOPY  2005 (approx)   CYSTOSCOPY WITH RETROGRADE PYELOGRAM, URETEROSCOPY AND STENT PLACEMENT Bilateral 07/23/2014   Procedure: CYSTOSCOPY WITH BILATERAL RETROGRADE PYELOGRAM, BILATERAL URETEROSCOPY AND STENT PLACEMENT, STONE EXTRACTION;  Surgeon: Darrell Williams Fragmin, MD;  [facility];  Service: Urology;  Laterality: Bilateral;   HOLMIUM LASER APPLICATION Bilateral 07/23/2014   Procedure: HOLMIUM LASER APPLICATION;  Surgeon: Darrell Williams Fragmin, MD;  Location: [facility];  Service: Urology;  Laterality: Bilateral;   INGUINAL HERNIA REPAIR Bilateral 1995  (approx)    Current Medications: Current Meds  Medication Sig   amLODipine  (NORVASC ) 5 MG tablet TAKE 1 TABLET BY MOUTH TWICE  DAILY   aspirin EC 81 MG tablet Take 81 mg by mouth daily.   b complex vitamins capsule Take 1 capsule by mouth daily. Unknown strenght   Bempedoic Acid-Ezetimibe  (NEXLIZET ) 180-10 MG TABS Take 1 tablet by mouth daily.   famotidine  (PEPCID ) 20 MG tablet TAKE 1 TABLET BY MOUTH TWO  TIMES DAILY (Patient taking differently: Take 20 mg by mouth as needed for heartburn or indigestion.)   fluticasone (FLONASE) 50 MCG/ACT nasal spray Place 2 sprays into both nostrils as needed for allergies or rhinitis.   furosemide  (LASIX ) 40 MG tablet Take 1 tablet (40 mg total) by mouth as needed for fluid or edema.   gabapentin (NEURONTIN) 100 MG capsule Take 100 mg by mouth as needed (nerve pain).   ipratropium (ATROVENT) 0.03 % nasal spray Place 1 spray into both nostrils daily.   KLOR-CON  M20 20 MEQ tablet Take 2 tablets (40 mEq total) by mouth every morning AND 1 tablet (20 mEq total) every evening.   latanoprost (XALATAN) 0.005 % ophthalmic solution Place 1 drop into both eyes at bedtime.   loratadine (CLARITIN) 10 MG tablet Take 10 mg by mouth daily as needed for allergies.   Melatonin 5 MG CAPS  Take 1 capsule by mouth at bedtime.   Multiple Vitamins-Minerals (MULTIVITAMIN WITH MINERALS) tablet Take 1 tablet by mouth daily. Unknown strength   Omega-3 1000 MG CAPS Take 1 capsule by mouth daily.   pantoprazole  (PROTONIX ) 40 MG tablet Take 1 tablet by mouth daily.   rosuvastatin  (CRESTOR ) 40 MG tablet TAKE 1 TABLET BY MOUTH DAILY   tamsulosin (FLOMAX) 0.4 MG CAPS capsule Take 0.4 mg by mouth in the morning and at bedtime.    timolol (TIMOPTIC) 0.5 % ophthalmic solution Place 1 drop into both eyes 2 (two) times daily.  VITAMIN A PO Take 1 tablet by mouth daily.   VITAMIN D, CHOLECALCIFEROL, PO Take 1 tablet by mouth daily. Unknown strength   vitamin E 400 UNIT capsule Take 400 Units by mouth daily.   [DISCONTINUED] ezetimibe  (ZETIA ) 10 MG tablet TAKE 1 TABLET BY MOUTH DAILY     Allergies:   Ace inhibitors and Codeine   Social History   Socioeconomic History   Marital status: Single    Spouse name: Not on file   Number of children: Not on file   Years of education: Not on file   Highest education level: Not on file  Occupational History   Not on file  Tobacco Use   Smoking status: Never   Smokeless tobacco: Never  Vaping Use   Vaping status: Never Used  Substance and Sexual Activity   Alcohol use: Yes    Comment: occasional   Drug use: No   Sexual activity: Not on file  Other Topics Concern   Not on file  Social History Narrative   Not on file   Social Drivers of Health   Financial Resource Strain: Low Risk  (04/13/2023)   Received from [facility]   Overall Financial Resource Strain (CARDIA)    Difficulty of Paying Living Expenses: Not hard at all  Food Insecurity: No Food Insecurity (04/13/2023)   Received from [facility]   Hunger Vital Sign    Within the past 12 months, you worried that your food would run out before you got the money to buy more.: Never true    Within the past 12 months, the food you bought just didn't last  and you didn't have money to get more.: Never true  Transportation Needs: No Transportation Needs (04/13/2023)   Received from [facility] - Transportation    In the past 12 months, has lack of transportation kept you from medical appointments or from getting medications?: No    Lack of Transportation (Non-Medical): No  Physical Activity: Not on file  Stress: Not on file  Social Connections: Not on file     Family History: The patient's family history includes Diabetes (age of onset: 61) in his paternal uncle; Hypertension in his father and mother; Stroke in his father. ROS:   Please see the history of present illness.    All 14 point review of systems negative except as described per history of present illness  EKGs/Labs/Other Studies Reviewed:         Recent Labs: 01/07/2024: BUN 19; Creatinine, Ser 1.56; Potassium 4.2; Sodium 143  Recent Lipid Panel    Component Value Date/Time   CHOL 147 01/07/2024 1452   TRIG 85 01/07/2024 1452   TRIG 68 07/14/2006 1515   HDL 64 01/07/2024 1452   CHOLHDL 2.3 01/07/2024 1452   CHOLHDL 2.5 07/27/2018 1522   VLDL 21 07/27/2018 1522   LDLCALC 67 01/07/2024 1452    Physical Exam:    VS:  BP 138/86   Pulse 67   Ht 6' (1.829 m)   Wt 164 lb (74.4 kg)   SpO2 99%   BMI 22.24 kg/m     Wt Readings from Last 3 Encounters:  04/21/24 164 lb (74.4 kg)  12/29/23 174 lb 6.4 oz (79.1 kg)  06/08/23 174 lb 9.6 oz (79.2 kg)     GEN:  Well nourished, well developed in no acute distress HEENT: Normal NECK: No JVD; No carotid bruits LYMPHATICS: No lymphadenopathy CARDIAC: RRR, no  murmurs, no rubs, no gallops RESPIRATORY:  Clear to auscultation without rales, wheezing or rhonchi  ABDOMEN: Soft, non-tender, non-distended MUSCULOSKELETAL:  No edema; No deformity  SKIN: Warm and dry LOWER EXTREMITIES: no swelling NEUROLOGIC:  Alert and oriented x 3 PSYCHIATRIC:  Normal affect   ASSESSMENT:    1. Essential  hypertension, benign   2. Mixed hyperlipidemia   3. Bilateral carotid artery stenosis   4. Atherosclerosis of native coronary artery of native heart without angina pectoris   5. Dyspnea on exertion    PLAN:    In order of problems listed above:  Carotic arterial disease obesity concerning gradually progressing.  He is on antiplatelet therapy which I will continue.  I will switch her from Zetia  to Nexlizet  at and continue Crestor  40.  His LDL last time was 67 HDL 64 but in spite of that he is progressing so we need to bring his LDL to less than 55. Mixed dyslipidemia plan as described above. Essential hypertension blood pressure seems to be well-controlled continue present management   Medication Adjustments/Labs and Tests Ordered: Current medicines are reviewed at length with the patient today.  Concerns regarding medicines are outlined above.  Orders Placed This Encounter  Procedures   Basic metabolic panel with GFR   ALT   AST   Lipid panel   VAS US  CAROTID   Medication changes:  Meds ordered this encounter  Medications   Bempedoic Acid-Ezetimibe  (NEXLIZET ) 180-10 MG TABS    Sig: Take 1 tablet by mouth daily.    Lot Number?:   [id number]    Expiration Date?:   02/20/2025    Signed, Darrell DOROTHA Fitch, MD, [facility] 04/21/2024 4:29 PM    [facility]

## 2024-04-22 LAB — BASIC METABOLIC PANEL WITH GFR
BUN/Creatinine Ratio: 17 (ref 10–24)
BUN: 30 mg/dL — ABNORMAL HIGH (ref 8–27)
CO2: 24 mmol/L (ref 20–29)
Calcium: 9.6 mg/dL (ref 8.6–10.2)
Chloride: 105 mmol/L (ref 96–106)
Creatinine, Ser: 1.75 mg/dL — ABNORMAL HIGH (ref 0.76–1.27)
Glucose: 106 mg/dL — ABNORMAL HIGH (ref 70–99)
Potassium: 4.8 mmol/L (ref 3.5–5.2)
Sodium: 144 mmol/L (ref 134–144)
eGFR: 39 mL/min/1.73 — ABNORMAL LOW (ref >59)

## 2024-04-28 ENCOUNTER — Ambulatory Visit: Payer: Self-pay | Admitting: Cardiology

## 2024-05-01 ENCOUNTER — Telehealth: Payer: Self-pay

## 2024-05-01 ENCOUNTER — Other Ambulatory Visit: Payer: Self-pay

## 2024-05-01 DIAGNOSIS — I2510 Atherosclerotic heart disease of native coronary artery without angina pectoris: Secondary | ICD-10-CM

## 2024-05-01 NOTE — Telephone Encounter (Signed)
 Patient called and was asking about his results from his lab work on 04/21/24. Patient was informed of his results. A copy of the patient's lab results was left at the front desk for the patient to pick up. Patient verbalized understanding and had no further questions at this time.

## 2024-06-01 ENCOUNTER — Telehealth: Payer: Self-pay | Admitting: Cardiology

## 2024-06-01 ENCOUNTER — Other Ambulatory Visit: Payer: Self-pay | Admitting: Cardiology

## 2024-06-01 ENCOUNTER — Telehealth: Payer: Self-pay

## 2024-06-01 MED ORDER — AMLODIPINE BESYLATE 5 MG PO TABS: 5.0000 mg | ORAL_TABLET | Freq: Two times a day (BID) | ORAL | 3 refills | Status: AC

## 2024-06-01 MED ORDER — NEXLIZET 180-10 MG PO TABS: 1.0000 | ORAL_TABLET | Freq: Every day | ORAL | Status: DC

## 2024-06-01 MED ORDER — KLOR-CON M20 20 MEQ PO TBCR: EXTENDED_RELEASE_TABLET | ORAL | 0 refills | Status: DC

## 2024-06-01 NOTE — Telephone Encounter (Signed)
 Nexlizet  samples # 28. Ref Klor Con 20mEq, Amlodipine  5mg  90 day supply ref x 3

## 2024-06-01 NOTE — Telephone Encounter (Signed)
 LVM to call regarding message.

## 2024-06-01 NOTE — Telephone Encounter (Signed)
 This pt is needing a call to go over medications and the need of a lab

## 2024-06-02 LAB — BASIC METABOLIC PANEL WITH GFR
BUN/Creatinine Ratio: 15 (ref 10–24)
BUN: 28 mg/dL — ABNORMAL HIGH (ref 8–27)
CO2: 24 mmol/L (ref 20–29)
Calcium: 9.7 mg/dL (ref 8.6–10.2)
Chloride: 103 mmol/L (ref 96–106)
Creatinine, Ser: 1.81 mg/dL — ABNORMAL HIGH (ref 0.76–1.27)
Glucose: 99 mg/dL (ref 70–99)
Potassium: 5.4 mmol/L — ABNORMAL HIGH (ref 3.5–5.2)
Sodium: 142 mmol/L (ref 134–144)
eGFR: 37 mL/min/1.73 — ABNORMAL LOW (ref >59)

## 2024-06-05 NOTE — Telephone Encounter (Signed)
 Pt states that he came into the office for labs. No additional help needed.

## 2024-06-08 ENCOUNTER — Ambulatory Visit: Payer: Self-pay | Admitting: Cardiology

## 2024-06-16 ENCOUNTER — Telehealth: Payer: Self-pay | Admitting: Cardiology

## 2024-06-16 ENCOUNTER — Telehealth: Payer: Self-pay

## 2024-06-16 NOTE — Telephone Encounter (Signed)
 Pt is requesting a call to go over his recent lab results

## 2024-06-16 NOTE — Telephone Encounter (Signed)
 LVM per DPR- per Dr. Vanetta Shawl note regarding lab results. Encouraged to call with any questions. Routed to PCP.

## 2024-06-16 NOTE — Telephone Encounter (Signed)
 Results reviewed with pt as per Dr. Vanetta Shawl note.  Pt verbalized understanding and had no additional questions. Routed to PCP

## 2024-06-28 ENCOUNTER — Telehealth: Payer: Self-pay | Admitting: Cardiology

## 2024-06-28 ENCOUNTER — Telehealth: Payer: Self-pay

## 2024-06-28 DIAGNOSIS — E782 Mixed hyperlipidemia: Secondary | ICD-10-CM

## 2024-06-28 MED ORDER — NITROGLYCERIN 0.4 MG SL SUBL: 0.4000 mg | SUBLINGUAL_TABLET | SUBLINGUAL | 6 refills | Status: AC | PRN

## 2024-06-28 NOTE — Telephone Encounter (Signed)
 Lipid, AST, ALT-per Dr. Karry notes.

## 2024-06-28 NOTE — Telephone Encounter (Signed)
 This pt is calling requesting to speak to a nurse regarding some questions he has about his medication,

## 2024-06-29 ENCOUNTER — Other Ambulatory Visit (HOSPITAL_BASED_OUTPATIENT_CLINIC_OR_DEPARTMENT_OTHER): Payer: Self-pay

## 2024-06-29 MED ORDER — NEXLIZET 180-10 MG PO TABS: 1.0000 | ORAL_TABLET | Freq: Every day | ORAL | Status: AC

## 2024-06-29 NOTE — Telephone Encounter (Signed)
 Left vm to return call.

## 2024-06-29 NOTE — Telephone Encounter (Signed)
 Spoke with pt who states that he does not have any questions regarding his medications. Pt states that his pharmacy does not have his klorcon in stock, advised I would check with our [facility].

## 2024-06-29 NOTE — Telephone Encounter (Signed)
 Pt aware that [facility] has KlorCOn and will callback to let us  know if we need to send RX. Nexlizet  samples given.

## 2024-06-30 LAB — LIPID PANEL
Chol/HDL Ratio: 2.8 ratio (ref 0.0–5.0)
Cholesterol, Total: 184 mg/dL (ref 100–199)
HDL: 66 mg/dL (ref >39)
LDL Chol Calc (NIH): 104 mg/dL — ABNORMAL HIGH (ref 0–99)
Triglycerides: 74 mg/dL (ref 0–149)
VLDL Cholesterol Cal: 14 mg/dL (ref 5–40)

## 2024-06-30 LAB — ALT: ALT: 8 IU/L (ref 0–44)

## 2024-06-30 LAB — AST: AST: 12 IU/L (ref 0–40)

## 2024-07-04 ENCOUNTER — Ambulatory Visit: Payer: Self-pay | Admitting: Cardiology

## 2024-07-06 ENCOUNTER — Ambulatory Visit: Admitting: Cardiology

## 2024-07-14 ENCOUNTER — Telehealth: Payer: Self-pay

## 2024-07-14 MED ORDER — POTASSIUM CHLORIDE CRYS ER 20 MEQ PO TBCR: 20.0000 meq | EXTENDED_RELEASE_TABLET | Freq: Every day | ORAL | 3 refills | Status: DC | PRN

## 2024-07-14 NOTE — Telephone Encounter (Signed)
 Spoke with pt regarding labs and medications. Dr. Krasowski recommended that the continue to tke Rosuvastatin  and Nexlizet . Sent Rx for potassium to use with Lasix  as needed.

## 2024-07-18 ENCOUNTER — Telehealth: Payer: Self-pay | Admitting: Cardiology

## 2024-07-18 NOTE — Telephone Encounter (Signed)
 Pt has questions about cloricon M 20? That him and dede had discussed.

## 2024-07-19 ENCOUNTER — Telehealth: Payer: Self-pay

## 2024-07-19 NOTE — Telephone Encounter (Signed)
 Spoke with pt regarding cholesterol results. He has been taking Nexlizet  but not Rosuvastatin . Dr. Krasowski recommended taking both Nexlizet  and Rosuvastatin  and re check cholesterol in 6 weeks. Pt verbalized understanding and had no further questions

## 2024-08-02 ENCOUNTER — Telehealth: Payer: Self-pay | Admitting: Cardiology

## 2024-08-02 ENCOUNTER — Other Ambulatory Visit (HOSPITAL_COMMUNITY): Payer: Self-pay

## 2024-08-02 ENCOUNTER — Other Ambulatory Visit: Payer: Self-pay

## 2024-08-02 ENCOUNTER — Other Ambulatory Visit (HOSPITAL_BASED_OUTPATIENT_CLINIC_OR_DEPARTMENT_OTHER): Payer: Self-pay

## 2024-08-02 ENCOUNTER — Telehealth: Payer: Self-pay | Admitting: Pharmacy Technician

## 2024-08-02 MED ORDER — POTASSIUM CHLORIDE CRYS ER 20 MEQ PO TBCR
20.0000 meq | EXTENDED_RELEASE_TABLET | Freq: Every day | ORAL | 3 refills | Status: AC | PRN
  Filled 2024-08-02: qty 30, 30d supply, fill #0

## 2024-08-02 MED ORDER — NEXLIZET 180-10 MG PO TABS: 1.0000 | ORAL_TABLET | Freq: Every day | ORAL | 0 refills | Status: AC

## 2024-08-02 NOTE — Telephone Encounter (Signed)
 Called the patient and informed him that the prior authorization for his Nexlizet  had been approved. Patient verbalized understanding and had no further questions at this time.

## 2024-08-02 NOTE — Telephone Encounter (Signed)
 Pharmacy Patient Advocate Encounter   Received notification from Physician's Office that prior authorization for nexlizet  is required/requested.   Insurance verification completed.   The patient is insured through [employer].   Per test claim: PA required; PA submitted to above mentioned insurance via Latent Key/confirmation #/EOC [id number] Status is pending

## 2024-08-02 NOTE — Telephone Encounter (Signed)
 Pharmacy Patient Advocate Encounter  Received notification from [facility] that Prior Authorization for nexlizet  has been APPROVED from 08/02/24 to 01/31/25. Ran test claim, Copay is $47.00- one month. This test claim was processed through [facility] copay amounts may vary at other pharmacies due to pharmacy/plan contracts, or as the patient moves through the different stages of their insurance plan.   PA #/Case ID/Reference #: [id number]

## 2024-08-02 NOTE — Telephone Encounter (Signed)
 Called the patient and he was asking if he can take Zinc and Magnesium Glyconate with his medications in his medication list? Please advise.

## 2024-08-02 NOTE — Telephone Encounter (Signed)
 Pt requesting a call from nurse regarding medication

## 2024-08-02 NOTE — Telephone Encounter (Signed)
 Pt is requesting samples of   Bempedoic Acid-Ezetimibe  (NEXLIZET ) 180-10 MG TABS

## 2024-08-04 ENCOUNTER — Other Ambulatory Visit: Payer: Self-pay

## 2024-08-04 MED ORDER — NEXLIZET 180-10 MG PO TABS: 1.0000 | ORAL_TABLET | Freq: Every day | ORAL | 3 refills | Status: AC

## 2024-08-04 NOTE — Telephone Encounter (Signed)
 Called the patient and informed him of Dr. Karry recommendation regarding starting Zinc and Magnesium glyconate below:  Yes  Patient verbalized understanding and had no further questions at this time.

## 2024-08-10 ENCOUNTER — Other Ambulatory Visit: Payer: Self-pay | Admitting: Cardiology

## 2024-08-21 ENCOUNTER — Ambulatory Visit

## 2024-09-14 ENCOUNTER — Ambulatory Visit: Attending: Cardiology

## 2024-09-14 DIAGNOSIS — I6523 Occlusion and stenosis of bilateral carotid arteries: Secondary | ICD-10-CM | POA: Insufficient documentation

## 2024-09-26 ENCOUNTER — Telehealth: Payer: Self-pay

## 2024-09-26 NOTE — Telephone Encounter (Signed)
Carotid US Results reviewed with pt as per Dr. Krasowski's note.  Pt verbalized understanding and had no additional questions. Routed to PCP  

## 2024-10-26 ENCOUNTER — Ambulatory Visit: Admitting: Cardiology
